# Patient Record
Sex: Female | Born: 1995 | Race: Black or African American | Hispanic: No | Marital: Single | Smoking: Former smoker
Health system: Southern US, Community
[De-identification: ages and names within clinical notes are randomized; demographics above are authoritative.]

## PROBLEM LIST (undated history)

## (undated) DIAGNOSIS — F419 Anxiety disorder, unspecified: Secondary | ICD-10-CM

## (undated) DIAGNOSIS — F329 Major depressive disorder, single episode, unspecified: Secondary | ICD-10-CM

## (undated) HISTORY — PX: NO PAST SURGERIES: SHX2092

## (undated) HISTORY — DX: Anxiety disorder, unspecified: F41.9

## (undated) HISTORY — PX: WISDOM TOOTH EXTRACTION: SHX21

---
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
## 2018-03-15 ENCOUNTER — Ambulatory Visit (HOSPITAL_COMMUNITY)
Admission: EM | Admit: 2018-03-15 | Discharge: 2018-03-15 | Disposition: A | Discharge status: Home / Self Care | Payer: Medicaid Other | Attending: Family Medicine | Admitting: Family Medicine

## 2018-03-15 ENCOUNTER — Ambulatory Visit (INDEPENDENT_AMBULATORY_CARE_PROVIDER_SITE_OTHER): Payer: Medicaid Other

## 2018-03-15 ENCOUNTER — Encounter (HOSPITAL_COMMUNITY): Payer: Self-pay | Admitting: Family Medicine

## 2018-03-15 DIAGNOSIS — S93492A Sprain of other ligament of left ankle, initial encounter: Secondary | ICD-10-CM

## 2018-03-15 NOTE — ED Provider Notes (Signed)
[facility]    CSN: [id number] Arrival date & time: 03/15/18  1432     History   Chief Complaint Chief Complaint  Patient presents with  . Ankle Pain    HPI Eileen Melton is a 22 y.o. female.   Patient was playing with her cousin 1 week ago and injured the ankle.  There was no associated swelling and she was able to continue playing and walking since then. 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  Today there is pain medial as well as laterally in the ligamentous area distal to the malleoli.  HPI  History reviewed. No pertinent past medical history.  There are no active problems to display for this patient.   History reviewed. No pertinent surgical history.  OB History   None      Home Medications    Prior to Admission medications   Medication Sig Start Date End Date Taking? Authorizing Provider  ibuprofen (ADVIL,MOTRIN) 200 MG tablet Take 400 mg by mouth every 8 (eight) hours as needed for pain.    [provider]    Family History History reviewed. No pertinent family history.  Social History Social History   Tobacco Use  . Smoking status: Not on file  Substance Use Topics  . Alcohol use: Not on file  . Drug use: Not on file     Allergies   Patient has no known allergies.   Review of Systems Review of Systems  Constitutional: Negative.   HENT: Positive for voice change.   Respiratory: Negative.   Cardiovascular: Negative.   Genitourinary: Negative.   Musculoskeletal:       Pain in left ankle  Neurological: Negative.   Psychiatric/Behavioral: Negative.      Physical Exam Triage Vital Signs ED Triage Vitals  Enc Vitals Group     BP 03/15/18 1452 126/64     Pulse Rate 03/15/18 1452 81     Resp 03/15/18 1452 18     Temp 03/15/18 1452 98.3 F (36.8 C)     Temp Source 03/15/18 1452 Oral     SpO2 03/15/18 1452 98 %     Weight --    Height --      Head Circumference --      Peak Flow --      Pain Score 03/15/18 1450 5     Pain Loc --      Pain Edu? --      Excl. in GC? --    No data found.  Updated Vital Signs BP 126/64   Pulse 81   Temp 98.3 F (36.8 C) (Oral)   Resp 18   LMP 03/01/2018   SpO2 98%   Visual Acuity Right Eye Distance:   Left Eye Distance:   Bilateral Distance:    Right Eye Near:   Left Eye Near:    Bilateral Near:     Physical Exam   UC Treatments / Results  Labs (all labs ordered are listed, but only abnormal results are displayed) Labs Reviewed - No data to display  EKG None Radiology No results found.  Procedures Procedures (including critical care time)  Medications Ordered in UC Medications - No data to display   Initial Impression / Assessment and Plan / UC Course  I have reviewed the triage vital signs and the nursing notes.  Pertinent labs & imaging results that  were available during my care of the patient were reviewed by me and considered in my medical decision making (see chart for details).    Sprain left ankle.  Treat conservatively with RICE therapy.  Encourage early ambulation as possible  Final Clinical Impressions(s) / UC Diagnoses   Final diagnoses:  None    ED Discharge Orders    None       Controlled Substance Prescriptions  Controlled Substance Registry consulted? Not Applicable   Frederica Kuster, MD 03/15/18 1549

## 2018-03-15 NOTE — ED Triage Notes (Signed)
Pt here for left ankle pain. Reports that she twisted It a week ago and this am was in the shower and felt a pop. She is also complaining of hoarseness.

## 2018-08-09 ENCOUNTER — Encounter (HOSPITAL_COMMUNITY): Payer: Self-pay | Admitting: Emergency Medicine

## 2018-08-09 ENCOUNTER — Ambulatory Visit (HOSPITAL_COMMUNITY)
Admission: EM | Admit: 2018-08-09 | Discharge: 2018-08-09 | Disposition: A | Discharge status: Home / Self Care | Payer: Medicaid Other | Attending: Family Medicine | Admitting: Family Medicine

## 2018-08-09 DIAGNOSIS — N10 Acute tubulo-interstitial nephritis: Secondary | ICD-10-CM | POA: Diagnosis not present

## 2018-08-09 DIAGNOSIS — R109 Unspecified abdominal pain: Secondary | ICD-10-CM | POA: Insufficient documentation

## 2018-08-09 DIAGNOSIS — I10 Essential (primary) hypertension: Secondary | ICD-10-CM | POA: Insufficient documentation

## 2018-08-09 DIAGNOSIS — Z885 Allergy status to narcotic agent status: Secondary | ICD-10-CM | POA: Insufficient documentation

## 2018-08-09 DIAGNOSIS — Z3202 Encounter for pregnancy test, result negative: Secondary | ICD-10-CM | POA: Diagnosis not present

## 2018-08-09 DIAGNOSIS — R110 Nausea: Secondary | ICD-10-CM | POA: Diagnosis not present

## 2018-08-09 LAB — POCT URINALYSIS DIP (DEVICE)
BILIRUBIN URINE: NEGATIVE
Glucose, UA: NEGATIVE mg/dL
KETONES UR: NEGATIVE mg/dL
Nitrite: NEGATIVE
PH: 6.5 (ref 5.0–8.0)
Protein, ur: NEGATIVE mg/dL
Specific Gravity, Urine: 1.01 (ref 1.005–1.030)
Urobilinogen, UA: 0.2 mg/dL (ref 0.0–1.0)

## 2018-08-09 LAB — POCT PREGNANCY, URINE: Preg Test, Ur: NEGATIVE

## 2018-08-09 MED ORDER — CEFTRIAXONE SODIUM 250 MG IJ SOLR
INTRAMUSCULAR | Status: AC
  Filled 2018-08-09: qty 250

## 2018-08-09 MED ORDER — CEPHALEXIN 500 MG PO CAPS: 500.0000 mg | ORAL_CAPSULE | Freq: Two times a day (BID) | ORAL | 0 refills | Status: DC

## 2018-08-09 MED ORDER — CEFTRIAXONE SODIUM 250 MG IJ SOLR
250.0000 mg | Freq: Once | INTRAMUSCULAR | Status: AC
  Administered 2018-08-09: 250 mg via INTRAMUSCULAR

## 2018-08-09 MED ORDER — IBUPROFEN 800 MG PO TABS: 800.0000 mg | ORAL_TABLET | Freq: Three times a day (TID) | ORAL | 0 refills | Status: DC

## 2018-08-09 NOTE — ED Provider Notes (Signed)
[facility]    CSN: [id number] Arrival date & time: 08/09/18  1935     History   Chief Complaint Chief Complaint  Patient presents with  . Flank Pain    HPI Eileen Melton is a 22 y.o. female.   HPI  Patient states she is had bladder infection symptoms for 2 or 3 days.  Some urinary frequency.  No dysuria.  Urgency to get to the bathroom quickly.  Today she developed flank pain.  Nausea.  Today she is also had a fever but did not take her temperature, she had some sweats and chills.  She is never had kidney infections before.  She did have a bacterial vaginal infection a month ago and took 7 days of Flagyl.  She states that this improved her symptoms.  History reviewed. No pertinent past medical history.  There are no active problems to display for this patient.   History reviewed. No pertinent surgical history.  OB History   None      Home Medications    Prior to Admission medications   Medication Sig Start Date End Date Taking? Authorizing Provider  cephALEXin (KEFLEX) 500 MG capsule Take 1 capsule (500 mg total) by mouth 2 (two) times daily. 08/09/18   Eustace MooreNelson, Kalei Mckillop Sue, MD  ibuprofen (ADVIL,MOTRIN) 800 MG tablet Take 1 tablet (800 mg total) by mouth 3 (three) times daily. 08/09/18   Eustace MooreNelson, Jimi Giza Sue, MD    Family History No family history on file. Patient states no history of kidney problems or kidney stones.  There is history of hypertension. Social History Social History   Tobacco Use  . Smoking status: Not on file  Substance Use Topics  . Alcohol use: Not on file  . Drug use: Not on file     Allergies   Hydrocodone-acetaminophen   Review of Systems Review of Systems  Constitutional: Positive for fatigue and fever. Negative for chills.  HENT: Negative for ear pain and sore throat.   Eyes: Negative for pain and visual disturbance.  Respiratory: Negative for cough and shortness of breath.   Cardiovascular: Negative for chest pain and  palpitations.  Gastrointestinal: Positive for nausea. Negative for abdominal pain and vomiting.  Genitourinary: Positive for dysuria, flank pain and frequency. Negative for hematuria.  Musculoskeletal: Negative for arthralgias and back pain.  Skin: Negative for color change and rash.  Neurological: Negative for seizures and syncope.  All other systems reviewed and are negative.    Physical Exam Triage Vital Signs ED Triage Vitals [08/09/18 1943]  Enc Vitals Group     BP 120/68     Pulse Rate 79     Resp 16     Temp 98.3 F (36.8 C)     Temp src      SpO2 100 %     Weight      Height      Head Circumference      Peak Flow      Pain Score      Pain Loc      Pain Edu?      Excl. in GC?    No data found.  Updated Vital Signs BP 120/68   Pulse 79   Temp 98.3 F (36.8 C)   Resp 16   LMP 07/26/2018   SpO2 100%   Visual Acuity Right Eye Distance:   Left Eye Distance:   Bilateral Distance:    Right Eye Near:   Left Eye Near:  Bilateral Near:     Physical Exam  Constitutional: She appears well-developed and well-nourished. No distress.  HENT:  Head: Normocephalic and atraumatic.  Right Ear: External ear normal.  Left Ear: External ear normal.  Mouth/Throat: Oropharynx is clear and moist.  Eyes: Pupils are equal, round, and reactive to light. Conjunctivae are normal.  Neck: Normal range of motion.  Cardiovascular: Normal rate, regular rhythm and normal heart sounds.  Pulmonary/Chest: Effort normal. No respiratory distress.  CVA tenderness right  Abdominal: Soft. Bowel sounds are normal. She exhibits no distension. There is tenderness.  Tenderness to deep palpation right mid abdomen  Musculoskeletal: Normal range of motion. She exhibits no edema.  Neurological: She is alert.  Skin: Skin is warm and dry.  Psychiatric: She has a normal mood and affect. Her behavior is normal.     UC Treatments / Results  Labs (all labs ordered are listed, but only abnormal  results are displayed) Labs Reviewed  POCT URINALYSIS DIP (DEVICE) - Abnormal; Notable for the following components:      Result Value   Hgb urine dipstick MODERATE (*)    Leukocytes, UA TRACE (*)    All other components within normal limits  URINE CULTURE  POCT PREGNANCY, URINE    EKG None  Radiology No results found.  Procedures Procedures (including critical care time)  Medications Ordered in UC Medications  cefTRIAXone (ROCEPHIN) injection 250 mg (250 mg Intramuscular Given 08/09/18 2018)    Initial Impression / Assessment and Plan / UC Course  I have reviewed the triage vital signs and the nursing notes.  Pertinent labs & imaging results that were available during my care of the patient were reviewed by me and considered in my medical decision making (see chart for details).     Gust pyelonephritis.  Treat with antibiotics.  Push fluids.  Return to ER for any worsening symptoms, vomiting, or inability to keep down antibiotics Final Clinical Impressions(s) / UC Diagnoses   Final diagnoses:  Acute pyelonephritis     Discharge Instructions     Take the antibiotic twice a day until gone Take ibuprofen 3 times a day with food, as needed for pain Push fluids.  Drink lots of water Return if you do not see improvement over the next 24 to 48 hours   ED Prescriptions    Medication Sig Dispense Auth. Provider   cephALEXin (KEFLEX) 500 MG capsule Take 1 capsule (500 mg total) by mouth 2 (two) times daily. 20 capsule Eustace Moore, MD   ibuprofen (ADVIL,MOTRIN) 800 MG tablet Take 1 tablet (800 mg total) by mouth 3 (three) times daily. 21 tablet Eustace Moore, MD     Controlled Substance Prescriptions [place] Controlled Substance Registry consulted? Not Applicable   Eustace Moore, MD 08/09/18 2042

## 2018-08-09 NOTE — Discharge Instructions (Signed)
Take the antibiotic twice a day until gone Take ibuprofen 3 times a day with food, as needed for pain Push fluids.  Drink lots of water Return if you do not see improvement over the next 24 to 48 hours

## 2018-08-09 NOTE — ED Triage Notes (Signed)
Pt c/o L flank pain starting today, with urinary frequency and urgency, pt also states she had a bacterial infection a month ago and thinks it didn't go away.

## 2018-08-12 ENCOUNTER — Telehealth (HOSPITAL_COMMUNITY): Payer: Self-pay

## 2018-08-12 LAB — URINE CULTURE: Culture: 20000 — AB

## 2018-08-12 NOTE — Telephone Encounter (Signed)
Urine culture positive for E.coli. This was treated with keflex at ucc visit. Pt called and made aware. 

## 2018-10-04 LAB — PREGNANCY, URINE: Preg Test, Ur: POSITIVE

## 2018-10-10 ENCOUNTER — Encounter: Payer: Self-pay | Admitting: *Deleted

## 2018-10-10 ENCOUNTER — Encounter (HOSPITAL_COMMUNITY): Payer: Self-pay | Admitting: Emergency Medicine

## 2018-10-10 ENCOUNTER — Ambulatory Visit (HOSPITAL_COMMUNITY)
Admission: EM | Admit: 2018-10-10 | Discharge: 2018-10-10 | Disposition: A | Discharge status: Home / Self Care | Payer: Medicaid Other | Attending: Family Medicine | Admitting: Family Medicine

## 2018-10-10 DIAGNOSIS — L500 Allergic urticaria: Secondary | ICD-10-CM

## 2018-10-10 MED ORDER — FAMOTIDINE 20 MG PO TABS
ORAL_TABLET | ORAL | Status: AC
  Filled 2018-10-10: qty 1

## 2018-10-10 MED ORDER — DIPHENHYDRAMINE HCL 50 MG/ML IJ SOLN
INTRAMUSCULAR | Status: AC
  Filled 2018-10-10: qty 1

## 2018-10-10 MED ORDER — DIPHENHYDRAMINE HCL 50 MG/ML IJ SOLN
50.0000 mg | Freq: Once | INTRAMUSCULAR | Status: AC
  Administered 2018-10-10: 50 mg via INTRAMUSCULAR

## 2018-10-10 MED ORDER — FAMOTIDINE 20 MG PO TABS
20.0000 mg | ORAL_TABLET | Freq: Once | ORAL | Status: AC
  Administered 2018-10-10: 20 mg via ORAL

## 2018-10-10 MED ORDER — CIMETIDINE 200 MG PO TABS: 400.0000 mg | ORAL_TABLET | Freq: Two times a day (BID) | ORAL | 0 refills | Status: DC | PRN

## 2018-10-10 NOTE — ED Triage Notes (Addendum)
PT developed hives all over body yesterday. PT saw her PCP today and was instructed to take benadryl. PT took 50 mg benadryl at 2:30pm. Hives have worsened since then. PT denies SOB, but reports a pain in center of chest that feels like she took a bite that she couldn't swallow.   PT is pregnant.

## 2018-10-10 NOTE — ED Provider Notes (Signed)
[facility]    CSN: [id number] Arrival date & time: 10/10/18  1815     History   Chief Complaint Chief Complaint  Patient presents with  . Urticaria    HPI Eileen Melton is a 22 y.o. female.   22 year old female presents with hives that started yesterday afternoon. She started breaking out in hives on the left side of her neck yesterday. She then had more itching on her legs and arms later yesterday and then broke out in hives on her legs and arms in the evening. Today the hives have spread to her face, chest, back, arms, legs and feet. She ate Okra recently and feels she may be allergic since she has not eaten this in many years. She took oral Benadryl yesterday and saw her PCP this morning who suggested continuing 50mg  Benadryl. Patient is currently about [redacted] weeks pregnant. She denies any fever, sore throat, difficulty swallowing or shortness of breath but she indicates her chest does feel "heavy". She denies any GI symptoms. Only current medication is Prenatal vitamins. No other chronic health issues.   The history is provided by the patient.    History reviewed. No pertinent past medical history.  There are no active problems to display for this patient.   Past Surgical History:  Procedure Laterality Date  . WISDOM TOOTH EXTRACTION      OB History    Gravida  1   Para      Term      Preterm      AB      Living        SAB      TAB      Ectopic      Multiple  1   Live Births               Home Medications    Prior to Admission medications   Medication Sig Start Date End Date Taking? Authorizing Provider  cimetidine (TAGAMET) 200 MG tablet Take 2 tablets (400 mg total) by mouth 2 (two) times daily as needed (for itching/hives). 10/10/18   Sudie Grumbling, NP    Family History No family history on file.  Social History Social History   Tobacco Use  . Smoking status: Not on file  Substance Use Topics  . Alcohol use: Not on  file  . Drug use: Not on file     Allergies   Hydrocodone-acetaminophen   Review of Systems Review of Systems  Constitutional: Negative for activity change, appetite change, chills, diaphoresis, fatigue and fever.  HENT: Negative for congestion, facial swelling, postnasal drip, rhinorrhea, sore throat and trouble swallowing.   Eyes: Negative for photophobia, pain, discharge, redness, itching and visual disturbance.  Respiratory: Positive for chest tightness. Negative for cough, shortness of breath, wheezing and stridor.   Cardiovascular: Negative for chest pain and palpitations.  Gastrointestinal: Negative for abdominal pain, nausea and vomiting.  Musculoskeletal: Negative for arthralgias and myalgias.  Skin: Positive for color change and rash. Negative for wound.  Allergic/Immunologic: Negative for environmental allergies.  Neurological: Negative for dizziness, tremors, seizures, syncope, weakness, light-headedness, numbness and headaches.  Hematological: Negative for adenopathy. Does not bruise/bleed easily.  Psychiatric/Behavioral: Negative.      Physical Exam Triage Vital Signs ED Triage Vitals  Enc Vitals Group     BP 10/10/18 1838 137/80     Pulse Rate 10/10/18 1838 (!) 109     Resp 10/10/18 1838 16  Temp 10/10/18 1838 98.4 F (36.9 C)     Temp Source 10/10/18 1838 Oral     SpO2 10/10/18 1838 100 %     Weight --      Height --      Head Circumference --      Peak Flow --      Pain Score 10/10/18 1839 3     Pain Loc --      Pain Edu? --      Excl. in GC? --    No data found.  Updated Vital Signs BP 137/80   Pulse (!) 109   Temp 98.4 F (36.9 C) (Oral)   Resp 16   LMP 07/26/2018   SpO2 100%   Breastfeeding? Unknown   Visual Acuity Right Eye Distance:   Left Eye Distance:   Bilateral Distance:    Right Eye Near:   Left Eye Near:    Bilateral Near:     Physical Exam  Constitutional: She is oriented to person, place, and time. She appears  well-developed and well-nourished. She is cooperative. She does not appear ill. No distress.  Patient sitting comfortably on exam table in no acute distress.   HENT:  Head: Normocephalic and atraumatic.  Right Ear: Hearing and external ear normal.  Left Ear: Hearing and external ear normal.  Nose: Nose normal. No mucosal edema.  Mouth/Throat: Uvula is midline, oropharynx is clear and moist and mucous membranes are normal. No oropharyngeal exudate.  Eyes: Pupils are equal, round, and reactive to light. Conjunctivae and EOM are normal. Right eye exhibits no discharge. Left eye exhibits no discharge.  Neck: Trachea normal and normal range of motion. Neck supple.  Cardiovascular: Regular rhythm, normal heart sounds and normal pulses. Tachycardia present.  Pulmonary/Chest: Effort normal and breath sounds normal. No accessory muscle usage or stridor. No tachypnea. No respiratory distress. She has no decreased breath sounds. She has no wheezes. She has no rhonchi. She has no rales.  Musculoskeletal: Normal range of motion.  Neurological: She is alert and oriented to person, place, and time.  Skin: Skin is warm and intact. Capillary refill takes less than 2 seconds. Rash noted. Rash is urticarial.     Extensive urticarial lesions present on forehead, cheeks, neck, chest, arms, legs, back and feet. No discharge or excoriation. Non-tender. No warmth.   Psychiatric: She has a normal mood and affect. Her behavior is normal. Judgment and thought content normal.  Vitals reviewed.    UC Treatments / Results  Labs (all labs ordered are listed, but only abnormal results are displayed) Labs Reviewed - No data to display  EKG None  Radiology No results found.  Procedures Procedures (including critical care time)  Medications Ordered in UC Medications  diphenhydrAMINE (BENADRYL) injection 50 mg (50 mg Intramuscular Given 10/10/18 1935)  famotidine (PEPCID) tablet 20 mg (20 mg Oral Given 10/10/18  1935)    Initial Impression / Assessment and Plan / UC Course  I have reviewed the triage vital signs and the nursing notes.  Pertinent labs & imaging results that were available during my care of the patient were reviewed by me and considered in my medical decision making (see chart for details).    Consulted with Dr. Delton See. Discussed with patient that usual treatment is with IM and oral Prednisone but Prednisone is not indicated in first trimester of pregnancy. Recommend Benadryl 50mg  IM now. Also gave oral Pepcid 20mg . Patient indicated after 30 minutes, that she did not itch as severely  and no new hives have appeared.  Discussed that since she is not in any immediate distress (no difficulty swallowing, breathing or shortness of breath), that she can monitor her symptoms. Recommend continue oral Benadryl 50mg  every 4 to 6 hours as needed for itching and hives. May also use oral Tagamet 200mg - 1 to 2 tablets every 12 hours as needed for itching. Use cool compresses and oatmeal baths for comfort. Avoid heat and warmth which will make her symptoms worse. If any difficulty breathing or swallowing, shortness of breath or more severe chest pain occur, go to the ER ASAP. Otherwise, follow-up with her PCP in 2 to 3 days for recheck.  Final Clinical Impressions(s) / UC Diagnoses   Final diagnoses:  Allergic urticaria     Discharge Instructions     You were given a shot of Benadryl 50mg  today along with oral Pepcid to help with the hives and itching. Recommend continue Benadryl 50mg  every 4 to 6 hours as needed. May also use Cimetidine 200mg  tablets- take 1 to 2 tablets twice a day as needed for hives. Use cool compresses/oatmeal baths for comfort. If any difficulty breathing or swallowing or worsening of hives occurs, go to ER ASAP. Otherwise, follow-up with your PCP in 2 to 3 days for recheck.     ED Prescriptions    Medication Sig Dispense Auth. Provider   cimetidine (TAGAMET) 200 MG tablet  Take 2 tablets (400 mg total) by mouth 2 (two) times daily as needed (for itching/hives). 20 tablet Sudie Grumbling, NP     Controlled Substance Prescriptions [place] Controlled Substance Registry consulted? N/A    Sudie Grumbling, NP 10/10/18 2113

## 2018-10-10 NOTE — Discharge Instructions (Addendum)
You were given a shot of Benadryl 50mg  today along with oral Pepcid to help with the hives and itching. Recommend continue Benadryl 50mg  every 4 to 6 hours as needed. May also use Cimetidine 200mg  tablets- take 1 to 2 tablets twice a day as needed for hives. Use cool compresses/oatmeal baths for comfort. If any difficulty breathing or swallowing or worsening of hives occurs, go to ER ASAP. Otherwise, follow-up with your PCP in 2 to 3 days for recheck.

## 2018-10-11 ENCOUNTER — Emergency Department (HOSPITAL_COMMUNITY)
Admission: EM | Admit: 2018-10-11 | Discharge: 2018-10-11 | Disposition: A | Discharge status: Home / Self Care | Payer: Medicaid Other | Attending: Emergency Medicine | Admitting: Emergency Medicine

## 2018-10-11 ENCOUNTER — Encounter (HOSPITAL_COMMUNITY): Payer: Self-pay | Admitting: *Deleted

## 2018-10-11 DIAGNOSIS — L509 Urticaria, unspecified: Secondary | ICD-10-CM | POA: Insufficient documentation

## 2018-10-11 DIAGNOSIS — R21 Rash and other nonspecific skin eruption: Secondary | ICD-10-CM | POA: Diagnosis present

## 2018-10-11 MED ORDER — DIPHENHYDRAMINE HCL 50 MG/ML IJ SOLN: 25.0000 mg | Freq: Once | INTRAMUSCULAR | Status: DC

## 2018-10-11 MED ORDER — TRIAMCINOLONE ACETONIDE 0.1 % EX CREA: 1.0000 "application " | TOPICAL_CREAM | Freq: Two times a day (BID) | CUTANEOUS | 0 refills | Status: DC

## 2018-10-11 MED ORDER — METHYLPREDNISOLONE SODIUM SUCC 40 MG IJ SOLR
40.0000 mg | Freq: Once | INTRAMUSCULAR | Status: AC
  Administered 2018-10-11: 40 mg via INTRAMUSCULAR
  Filled 2018-10-11: qty 1

## 2018-10-11 NOTE — ED Provider Notes (Signed)
Patient placed in Quick Look pathway, seen and evaluated   Chief Complaint: allergic reaction  HPI:  Eileen Melton is a 22 y.o. female who presents to the ED with rash and itching that started 2 days ago. Patient states she went to Urgent Care yesterday and they gave her Benadryl and Pepcid. Patient reports symptoms improved but then this morning they were worse again. Patient reports she had a positive pregnancy test yesterday at Urgent Care. Patient denies difficulty swallowing or respiratory symptoms.  ROS: Skin: rash, itching  Physical Exam:  BP 131/79 (BP Location: Right Arm)   Pulse (!) 113   Temp 98.2 F (36.8 C) (Oral)   Resp 20   LMP 07/26/2018   SpO2 100%    Gen: No distress  Neuro: Awake and Alert  Skin: hives to face and arms  Resp: no distress  ENT: no difficulty swallowing   Initiation of care has begun. The patient has been counseled on the process, plan, and necessity for staying for the completion/evaluation, and the remainder of the medical screening examination    Janne Napoleon, NP 10/11/18 1340    Eber Hong, MD 10/12/18 [phone]

## 2018-10-11 NOTE — Discharge Instructions (Addendum)
You have received a shot of steroids today that should help with the rash. Continue to take the Benadryl and Pepcid as you were directed too. Rash should begin to resolve over the next few days especially with help from the steroid. I have also sent in a steroid cream that you can use on smaller areas that persist.  Please follow-up with your PCP if the rash is still significant after 1 week.  Thank you for allowing Eileen Melton to take care of you today.

## 2018-10-11 NOTE — ED Triage Notes (Signed)
Pt in with worsening allergic reaction, pt had initial reaction two days, has been taking benadryl and yesterday went to urgent care yesterday and got IM benadryl and pepcid, this morning before a shower she had significant increase in rash and swelling, rash had never fully resolved but is now raised and itching more, denies airway issues at this time

## 2018-10-11 NOTE — ED Provider Notes (Signed)
[facility] EMERGENCY DEPARTMENT Provider Note  CSN: [id number] Arrival date & time: 10/11/18  1308  History   Chief Complaint Chief Complaint  Patient presents with  . Allergic Reaction    HPI Eileen Melton is a 22 y.o. female who is in 1st trimester of pregnancyith who presented to the ED for rash. She reports that raised, pruritic lesions first appeared on her neck 2 days ago after eating okra and Nerds. She has been taking scheduled PO Benadryl and Pepcid with minimal relief. She states that rash is spread to her face and extremities was seen at urgent care yesterday for the same issue and received IM Benadryl there which provided relief from itching.  Rash   This is a new problem. The current episode started 2 days ago. The problem has not changed since onset.Associated with: new food exposure (okra) There has been no fever. The rash is present on the face, neck, right arm, left arm, left upper leg, left lower leg, right upper leg and right lower leg. Associated symptoms include itching. Pertinent negatives include no blisters, no pain and no weeping. Treatments tried: Benadryl and Pepcid. The treatment provided mild relief.    History reviewed. No pertinent past medical history.  There are no active problems to display for this patient.   Past Surgical History:  Procedure Laterality Date  . WISDOM TOOTH EXTRACTION       OB History    Gravida  1   Para      Term      Preterm      AB      Living        SAB      TAB      Ectopic      Multiple  1   Live Births               Home Medications    Prior to Admission medications   Medication Sig Start Date End Date Taking? Authorizing Provider  cimetidine (TAGAMET) 200 MG tablet Take 2 tablets (400 mg total) by mouth 2 (two) times daily as needed (for itching/hives). 10/10/18   Sudie Grumbling, NP    Family History History reviewed. No pertinent family history.  Social  History Social History   Tobacco Use  . Smoking status: Not on file  Substance Use Topics  . Alcohol use: Not on file  . Drug use: Not on file     Allergies   Hydrocodone-acetaminophen   Review of Systems Review of Systems  Constitutional: Negative.   HENT: Negative.   Eyes: Negative.   Respiratory: Negative for cough, choking, chest tightness and shortness of breath.   Cardiovascular: Positive for chest pain. Negative for palpitations.  Gastrointestinal: Negative for abdominal pain, nausea and vomiting.  Musculoskeletal: Negative.   Skin: Positive for itching and rash.  Neurological: Negative for weakness and numbness.  Hematological: Negative.    Physical Exam Updated Vital Signs BP 131/79 (BP Location: Right Arm)   Pulse (!) 113   Temp 98.2 F (36.8 C) (Oral)   Resp 20   LMP 07/26/2018   SpO2 100%   Physical Exam  Constitutional: She appears well-developed and well-nourished.  Sitting comfortably in chair.  HENT:  Head: Normocephalic and atraumatic.  Mouth/Throat: Uvula is midline, oropharynx is clear and moist and mucous membranes are normal.  Cardiovascular: Normal rate, regular rhythm and normal heart sounds.  Pulmonary/Chest: Effort normal and breath sounds normal. No tachypnea. No respiratory  distress. She has no decreased breath sounds. She has no wheezes.  Musculoskeletal: Normal range of motion.  Skin: Skin is warm and intact. Capillary refill takes less than 2 seconds. Rash noted. Rash is urticarial.  Urticarial rash on patient's face, neck, bilateral upper and lower extremities and trunk. No surrounding erythema, streaking, blisters, ulcerations, drainage or areas of desquamation. Rash non-tender to palpation.  Nursing note and vitals reviewed.    ED Treatments / Results  Labs (all labs ordered are listed, but only abnormal results are displayed) Labs Reviewed - No data to display  EKG None  Radiology No results  found.  Procedures Procedures (including critical care time)  Medications Ordered in ED Medications - No data to display   Initial Impression / Assessment and Plan / ED Course  Triage vital signs and the nursing notes have been reviewed.  Pertinent labs & imaging results that were available during care of the patient were reviewed and considered in medical decision making (see chart for details).   Patient presents to the ED afebrile and well appearing with complaints of an urticarial rash that has been present for 2 days. She correlates rash with new food exposure 2 days ago. Rash is on her face, extremities and trunk. Patient has had itching relief with Benadryl and Pepcid and was seen at the urgent care for the same complaint. There is no blistering, bleeding, burns, drainage, ulcerations or desquamation to suggest SJS/TEN or acute necrotizing derm process.   Final Clinical Impressions(s) / ED Diagnoses  1. Urticaria. IM Solu-Medrol 40mg  x1 given in the ED to assist with resolution as they are widespread. No signs of anaphylaxis. Advised to continue Benadryl and Pepcid for relief. Rx for Kenalog also given for smaller areas of rash that go unresolved.  Dispo: Home. After thorough clinical evaluation, this patient is determined to be medically stable and can be safely discharged with the previously mentioned treatment and/or outpatient follow-up/referral(s). At this time, there are no other apparent medical conditions that require further screening, evaluation or treatment.   Final diagnoses:  Urticaria    ED Discharge Orders    None        Reva Bores 10/11/18 1631    Donnetta Hutching, MD 10/12/18 [phone]

## 2018-10-21 ENCOUNTER — Other Ambulatory Visit (HOSPITAL_COMMUNITY)
Admission: RE | Admit: 2018-10-21 | Discharge: 2018-10-21 | Disposition: A | Discharge status: Home / Self Care | Payer: Medicaid Other | Source: Ambulatory Visit | Attending: Obstetrics & Gynecology | Admitting: Obstetrics & Gynecology

## 2018-10-21 ENCOUNTER — Ambulatory Visit (INDEPENDENT_AMBULATORY_CARE_PROVIDER_SITE_OTHER): Payer: Medicaid Other | Admitting: Obstetrics & Gynecology

## 2018-10-21 ENCOUNTER — Ambulatory Visit: Payer: Self-pay | Admitting: Clinical

## 2018-10-21 ENCOUNTER — Encounter: Payer: Self-pay | Admitting: Obstetrics & Gynecology

## 2018-10-21 VITALS — BP 120/68 | HR 86 | Wt 127.3 lb

## 2018-10-21 DIAGNOSIS — Z3491 Encounter for supervision of normal pregnancy, unspecified, first trimester: Secondary | ICD-10-CM | POA: Insufficient documentation

## 2018-10-21 DIAGNOSIS — Z640 Problems related to unwanted pregnancy: Secondary | ICD-10-CM

## 2018-10-21 DIAGNOSIS — Z3490 Encounter for supervision of normal pregnancy, unspecified, unspecified trimester: Secondary | ICD-10-CM | POA: Insufficient documentation

## 2018-10-21 DIAGNOSIS — F1290 Cannabis use, unspecified, uncomplicated: Secondary | ICD-10-CM | POA: Insufficient documentation

## 2018-10-21 DIAGNOSIS — Z3A08 8 weeks gestation of pregnancy: Secondary | ICD-10-CM

## 2018-10-21 LAB — POCT URINALYSIS DIP (DEVICE)
BILIRUBIN URINE: NEGATIVE
Glucose, UA: NEGATIVE mg/dL
HGB URINE DIPSTICK: NEGATIVE
Ketones, ur: NEGATIVE mg/dL
LEUKOCYTES UA: NEGATIVE
Nitrite: NEGATIVE
PH: 7 (ref 5.0–8.0)
Protein, ur: NEGATIVE mg/dL
SPECIFIC GRAVITY, URINE: 1.015 (ref 1.005–1.030)
UROBILINOGEN UA: 1 mg/dL (ref 0.0–1.0)

## 2018-10-21 NOTE — Patient Instructions (Signed)
First Trimester of Pregnancy The first trimester of pregnancy is from week 1 until the end of week 13 (months 1 through 3). A week after a sperm fertilizes an egg, the egg will implant on the wall of the uterus. This embryo will begin to develop into a baby. Genes from you and your partner will form the baby. The female genes will determine whether the baby will be a boy or a girl. At 6-8 weeks, the eyes and face will be formed, and the heartbeat can be seen on ultrasound. At the end of 12 weeks, all the baby's organs will be formed. Now that you are pregnant, you will want to do everything you can to have a healthy baby. Two of the most important things are to get good prenatal care and to follow your health care provider's instructions. Prenatal care is all the medical care you receive before the baby's birth. This care will help prevent, find, and treat any problems during the pregnancy and childbirth. Body changes during your first trimester Your body goes through many changes during pregnancy. The changes vary from woman to woman.  You may gain or lose a couple of pounds at first.  You may feel sick to your stomach (nauseous) and you may throw up (vomit). If the vomiting is uncontrollable, call your health care provider.  You may tire easily.  You may develop headaches that can be relieved by medicines. All medicines should be approved by your health care provider.  You may urinate more often. Painful urination may mean you have a bladder infection.  You may develop heartburn as a result of your pregnancy.  You may develop constipation because certain hormones are causing the muscles that push stool through your intestines to slow down.  You may develop hemorrhoids or swollen veins (varicose veins).  Your breasts may begin to grow larger and become tender. Your nipples may stick out more, and the tissue that surrounds them (areola) may become darker.  Your gums may bleed and may be  sensitive to brushing and flossing.  Dark spots or blotches (chloasma, mask of pregnancy) may develop on your face. This will likely fade after the baby is born.  Your menstrual periods will stop.  You may have a loss of appetite.  You may develop cravings for certain kinds of food.  You may have changes in your emotions from day to day, such as being excited to be pregnant or being concerned that something may go wrong with the pregnancy and baby.  You may have more vivid and strange dreams.  You may have changes in your hair. These can include thickening of your hair, rapid growth, and changes in texture. Some women also have hair loss during or after pregnancy, or hair that feels dry or thin. Your hair will most likely return to normal after your baby is born.  What to expect at prenatal visits During a routine prenatal visit:  You will be weighed to make sure you and the baby are growing normally.  Your blood pressure will be taken.  Your abdomen will be measured to track your baby's growth.  The fetal heartbeat will be listened to between weeks 10 and 14 of your pregnancy.  Test results from any previous visits will be discussed.  Your health care provider may ask you:  How you are feeling.  If you are feeling the baby move.  If you have had any abnormal symptoms, such as leaking fluid, bleeding, severe headaches,   or abdominal cramping.  If you are using any tobacco products, including cigarettes, chewing tobacco, and electronic cigarettes.  If you have any questions.  Other tests that may be performed during your first trimester include:  Blood tests to find your blood type and to check for the presence of any previous infections. The tests will also be used to check for low iron levels (anemia) and protein on red blood cells (Rh antibodies). Depending on your risk factors, or if you previously had diabetes during pregnancy, you may have tests to check for high blood  sugar that affects pregnant women (gestational diabetes).  Urine tests to check for infections, diabetes, or protein in the urine.  An ultrasound to confirm the proper growth and development of the baby.  Fetal screens for spinal cord problems (spina bifida) and Down syndrome.  HIV (human immunodeficiency virus) testing. Routine prenatal testing includes screening for HIV, unless you choose not to have this test.  You may need other tests to make sure you and the baby are doing well.  Follow these instructions at home: Medicines  Follow your health care provider's instructions regarding medicine use. Specific medicines may be either safe or unsafe to take during pregnancy.  Take a prenatal vitamin that contains at least 600 micrograms (mcg) of folic acid.  If you develop constipation, try taking a stool softener if your health care provider approves. Eating and drinking  Eat a balanced diet that includes fresh fruits and vegetables, whole grains, good sources of protein such as meat, eggs, or tofu, and low-fat dairy. Your health care provider will help you determine the amount of weight gain that is right for you.  Avoid raw meat and uncooked cheese. These carry germs that can cause birth defects in the baby.  Eating four or five small meals rather than three large meals a day may help relieve nausea and vomiting. If you start to feel nauseous, eating a few soda crackers can be helpful. Drinking liquids between meals, instead of during meals, also seems to help ease nausea and vomiting.  Limit foods that are high in fat and processed sugars, such as fried and sweet foods.  To prevent constipation: ? Eat foods that are high in fiber, such as fresh fruits and vegetables, whole grains, and beans. ? Drink enough fluid to keep your urine clear or pale yellow. Activity  Exercise only as directed by your health care provider. Most women can continue their usual exercise routine during  pregnancy. Try to exercise for 30 minutes at least 5 days a week. Exercising will help you: ? Control your weight. ? Stay in shape. ? Be prepared for labor and delivery.  Experiencing pain or cramping in the lower abdomen or lower back is a good sign that you should stop exercising. Check with your health care provider before continuing with normal exercises.  Try to avoid standing for long periods of time. Move your legs often if you must stand in one place for a long time.  Avoid heavy lifting.  Wear low-heeled shoes and practice good posture.  You may continue to have sex unless your health care provider tells you not to. Relieving pain and discomfort  Wear a good support bra to relieve breast tenderness.  Take warm sitz baths to soothe any pain or discomfort caused by hemorrhoids. Use hemorrhoid cream if your health care provider approves.  Rest with your legs elevated if you have leg cramps or low back pain.  If you develop   varicose veins in your legs, wear support hose. Elevate your feet for 15 minutes, 3-4 times a day. Limit salt in your diet. Prenatal care  Schedule your prenatal visits by the twelfth week of pregnancy. They are usually scheduled monthly at first, then more often in the last 2 months before delivery.  Write down your questions. Take them to your prenatal visits.  Keep all your prenatal visits as told by your health care provider. This is important. Safety  Wear your seat belt at all times when driving.  Make a list of emergency phone numbers, including numbers for family, friends, the hospital, and police and fire departments. General instructions  Ask your health care provider for a referral to a local prenatal education class. Begin classes no later than the beginning of month 6 of your pregnancy.  Ask for help if you have counseling or nutritional needs during pregnancy. Your health care provider can offer advice or refer you to specialists for help  with various needs.  Do not use hot tubs, steam rooms, or saunas.  Do not douche or use tampons or scented sanitary pads.  Do not cross your legs for long periods of time.  Avoid cat litter boxes and soil used by cats. These carry germs that can cause birth defects in the baby and possibly loss of the fetus by miscarriage or stillbirth.  Avoid all smoking, herbs, alcohol, and medicines not prescribed by your health care provider. Chemicals in these products affect the formation and growth of the baby.  Do not use any products that contain nicotine or tobacco, such as cigarettes and e-cigarettes. If you need help quitting, ask your health care provider. You may receive counseling support and other resources to help you quit.  Schedule a dentist appointment. At home, brush your teeth with a soft toothbrush and be gentle when you floss. Contact a health care provider if:  You have dizziness.  You have mild pelvic cramps, pelvic pressure, or nagging pain in the abdominal area.  You have persistent nausea, vomiting, or diarrhea.  You have a bad smelling vaginal discharge.  You have pain when you urinate.  You notice increased swelling in your face, hands, legs, or ankles.  You are exposed to fifth disease or chickenpox.  You are exposed to German measles (rubella) and have never had it. Get help right away if:  You have a fever.  You are leaking fluid from your vagina.  You have spotting or bleeding from your vagina.  You have severe abdominal cramping or pain.  You have rapid weight gain or loss.  You vomit blood or material that looks like coffee grounds.  You develop a severe headache.  You have shortness of breath.  You have any kind of trauma, such as from a fall or a car accident. Summary  The first trimester of pregnancy is from week 1 until the end of week 13 (months 1 through 3).  Your body goes through many changes during pregnancy. The changes vary from  woman to woman.  You will have routine prenatal visits. During those visits, your health care provider will examine you, discuss any test results you may have, and talk with you about how you are feeling. This information is not intended to replace advice given to you by your health care provider. Make sure you discuss any questions you have with your health care provider. Document Released: 11/28/2001 Document Revised: 11/15/2016 Document Reviewed: 11/15/2016 Elsevier Interactive Patient Education  2018 Elsevier   Inc.  

## 2018-10-21 NOTE — Progress Notes (Signed)
Subjective:   Eileen Melton is a 22 y.o. G1P0000 at [redacted]w[redacted]d by LMP being seen today for her first obstetrical visit.  Patient is considering termination of pregnancy, unsure at this point. No problems. Uses THC occasionally. Pregnancy history fully reviewed.  Patient reports no complaints.  HISTORY: OB History  Gravida Para Term Preterm AB Living  1 0 0 0 0 0  SAB TAB Ectopic Multiple Live Births  0 0 0 0 0    # Outcome Date GA Lbr Len/2nd Weight Sex Delivery Anes PTL Lv  1 Current           Has not received HPV vaccine series.  History reviewed. No pertinent past medical history. Past Surgical History:  Procedure Laterality Date  . WISDOM TOOTH EXTRACTION     Family History  Problem Relation Age of Onset  . Aneurysm Mother   . Cancer Maternal Grandmother    Social History   Tobacco Use  . Smoking status: Never Smoker  . Smokeless tobacco: Never Used  Substance Use Topics  . Alcohol use: Not Currently  . Drug use: Yes    Types: Marijuana   Allergies  Allergen Reactions  . Hydrocodone-Acetaminophen Itching   Current Outpatient Medications on File Prior to Visit  Medication Sig Dispense Refill  . Prenatal Vit-Fe Fumarate-FA (MULTIVITAMIN-PRENATAL) 27-0.8 MG TABS tablet Take 1 tablet by mouth daily at 12 noon.     No current facility-administered medications on file prior to visit.     Review of Systems Pertinent items noted in HPI and remainder of comprehensive ROS otherwise negative.  Exam   Vitals:   10/21/18 1108  BP: 120/68  Pulse: 86  Weight: 127 lb 4.8 oz (57.7 kg)   Fetal Heart Rate (bpm): 155 on u/s. Patient informed that the ultrasound is considered a limited obstetric ultrasound and is not intended to be a complete ultrasound exam.  Patient also informed that the ultrasound is not being completed with the intent of assessing for fetal or placental anomalies or any pelvic abnormalities.  Explained that the purpose of today's ultrasound is to  assess for fetal heart rate.  Patient acknowledges the purpose of the exam and the limitations of the study  General: well-developed, well-nourished female in no acute distress  Breasts:  deferred  Skin: normal coloration and turgor, no rashes  Neurologic: oriented, normal, negative, normal mood  Extremities: normal strength, tone, and muscle mass, ROM of all joints is normal  HEENT PERRLA, extraocular movement intact and sclera clear, anicteric  Mouth/Teeth mucous membranes moist, pharynx normal without lesions and dental hygiene good  Neck supple and no masses  Cardiovascular: regular rate and rhythm  Respiratory:  no respiratory distress, normal breath sounds  Abdomen: soft, non-tender; bowel sounds normal; no masses,  no organomegaly  Pelvic: deferred    Assessment and Plan:  Pregnancy: G1P0000  1. Marijuana use Cautioned about use in pregnancy.  2. Unwanted pregnancy with possible plans for termination She was informed about resources for this.   3. Encounter for supervision of low-risk pregnancy in first trimester - [facility] AMB BABYSCRIPTS SCHEDULE OPTIMIZATION Initial labs including NIPS deferred until next visit. Continue prenatal vitamins. Ultrasound discussed; fetal anatomic survey: to be ordered later if needed. Problem list reviewed and updated. The nature of [facility] with multiple MDs and other Advanced Practice Providers was explained to patient; also emphasized that residents, students are part of our team. Routine obstetric precautions reviewed. Return  in about 4 weeks (around 11/18/2018) for OB 12 week visit (Babyscripts) and  labs including NIPS. This visit type can be changed to discuss contraception if she proceeds with termination of pregnancy; she was given information for LARCs . Also given information about HPV vaccine.     Jaynie Collins, MD, FACOG Obstetrician & Gynecologist, [facility] for [employer], [facility]

## 2018-10-21 NOTE — BH Specialist Note (Signed)
Integrated Behavioral Health Initial Visit  MRN: [id number] Name: EMMRY HINSCH  Number of Integrated Behavioral Health Clinician visits:: 1/6 Session Start time: 10:20  Session End time: 10:31 Total time: 15 minutes  Type of Service: Integrated Behavioral Health- Individual/Family Interpretor:No. Interpretor Name and Language: n/a   Warm Hand Off Completed.       SUBJECTIVE: TESHARA MOREE is a 22 y.o. female accompanied by Sibling Patient was referred by Jaynie Collins, MD for Initial OB introduction to integrated behavioral health services . Patient reports the following symptoms/concerns: Pt states no particular concerns at this time.  Duration of problem: n/a; Severity of problem: n/a  OBJECTIVE: Mood: Normal and Affect: Appropriate Risk of harm to self or others: No plan to harm self or others  LIFE CONTEXT: Family and Social: Pt has supportive family School/Work: - Self-Care: - Life Changes: Current pregnancy  GOALS ADDRESSED: Patient will: 1. Increase knowledge and/or ability of: healthy habits   INTERVENTIONS: Interventions utilized: Supportive Counseling  Standardized Assessments completed: GAD-7 and PHQ 9  ASSESSMENT: Patient currently experiencing Supervision of low-risk  pregnancy, in first trimester.   Patient may benefit from Initial OB introduction to integrated behavioral health services .  PLAN: 1. Follow up with behavioral health clinician on : As needed 2. Behavioral recommendations: n/a 3. Referral(s): Integrated [employer] (In Clinic) 4. "From scale of 1-10, how likely are you to follow plan?": 10  Rae Lips, LCSW  Depression screen [facility] 2/9 10/21/2018  Decreased Interest 0  Down, Depressed, Hopeless 0  PHQ - 2 Score 0  Altered sleeping 0  Tired, decreased energy 1  Change in appetite 1  Feeling bad or failure about yourself  0  Trouble concentrating 0  Moving slowly or fidgety/restless 0  Suicidal thoughts 0   PHQ-9 Score 2   GAD 7 : Generalized Anxiety Score 10/21/2018  Nervous, Anxious, on Edge 0  Control/stop worrying 1  Worry too much - different things 1  Trouble relaxing 0  Restless 0  Easily annoyed or irritable 1  Afraid - awful might happen 0  Total GAD 7 Score 3

## 2018-10-21 NOTE — Progress Notes (Signed)
Bedside ultrasound performed for FHR. FHR 164 bpm

## 2018-10-22 LAB — GC/CHLAMYDIA PROBE AMP (~~LOC~~) NOT AT ARMC
Chlamydia: NEGATIVE
Neisseria Gonorrhea: NEGATIVE

## 2018-11-19 ENCOUNTER — Encounter: Payer: Self-pay | Admitting: Family Medicine

## 2019-04-17 ENCOUNTER — Telehealth: Payer: Self-pay | Admitting: Family Medicine

## 2019-04-17 NOTE — Telephone Encounter (Signed)
Spoke with patient about her appointments. She stated she was no longer pregnant.

## 2019-07-20 IMAGING — DX DG ANKLE COMPLETE 3+V*L*
3 series · 3 of 3 positions shown · non-contrast
Comparison: None.

CLINICAL DATA: Felt something pop in left ankle. Left anterior
joint pain.

EXAM:
LEFT ANKLE COMPLETE - 3+ VIEW

[ankle ap]
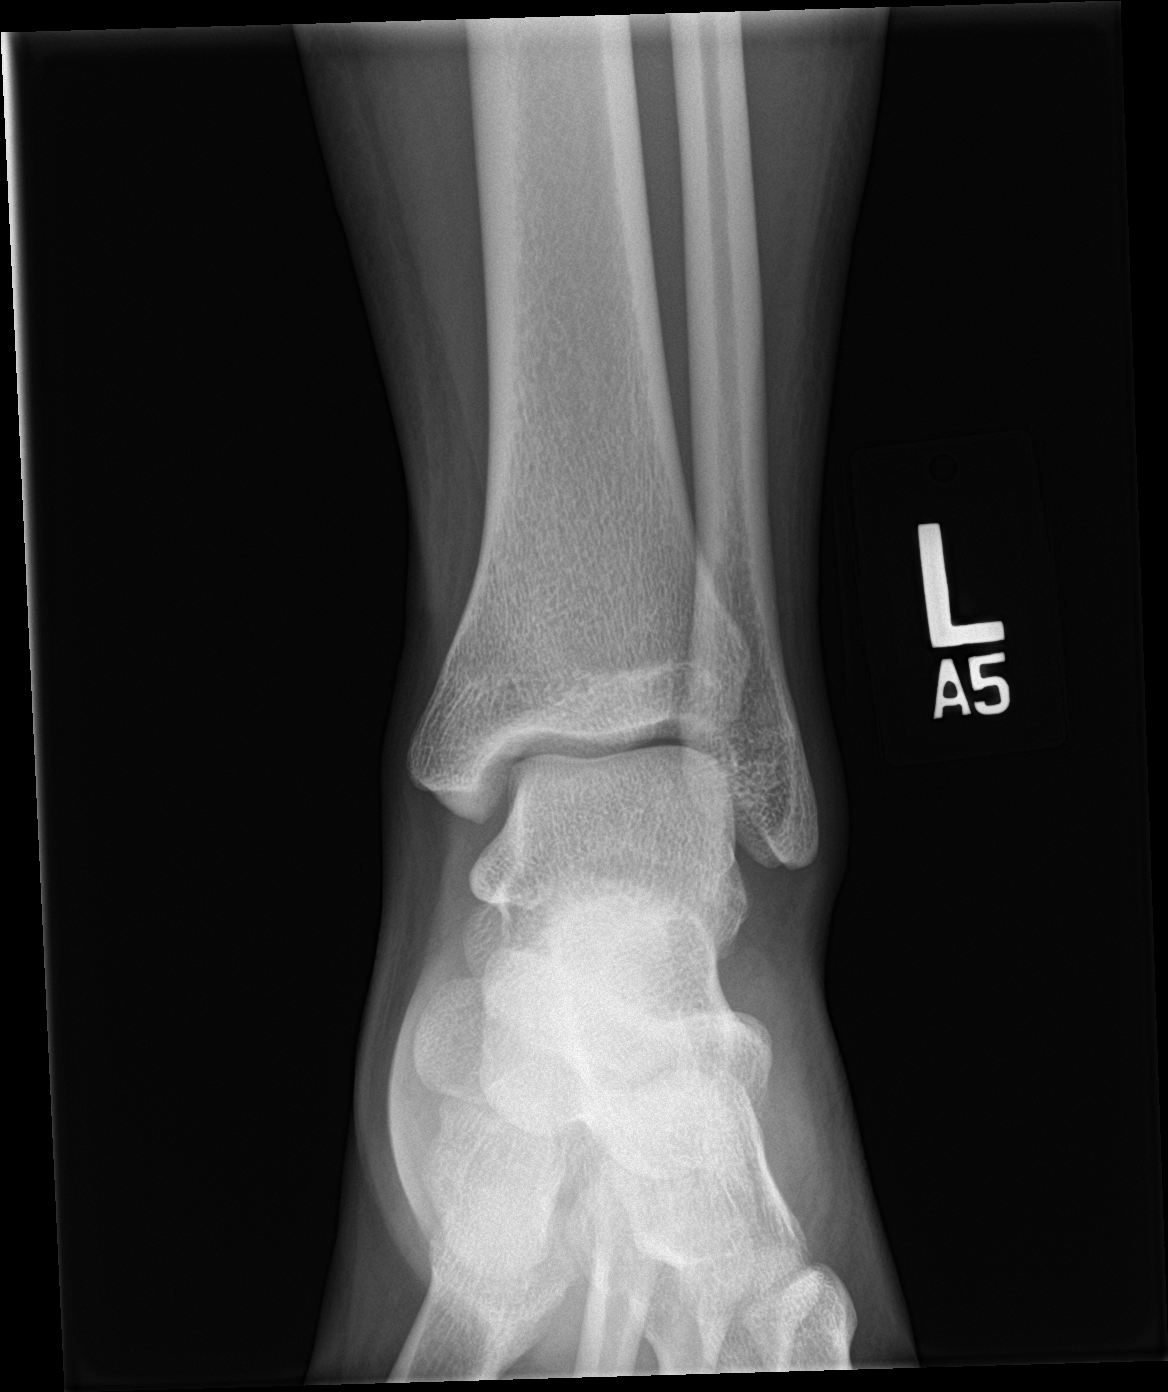

[ankle obl]
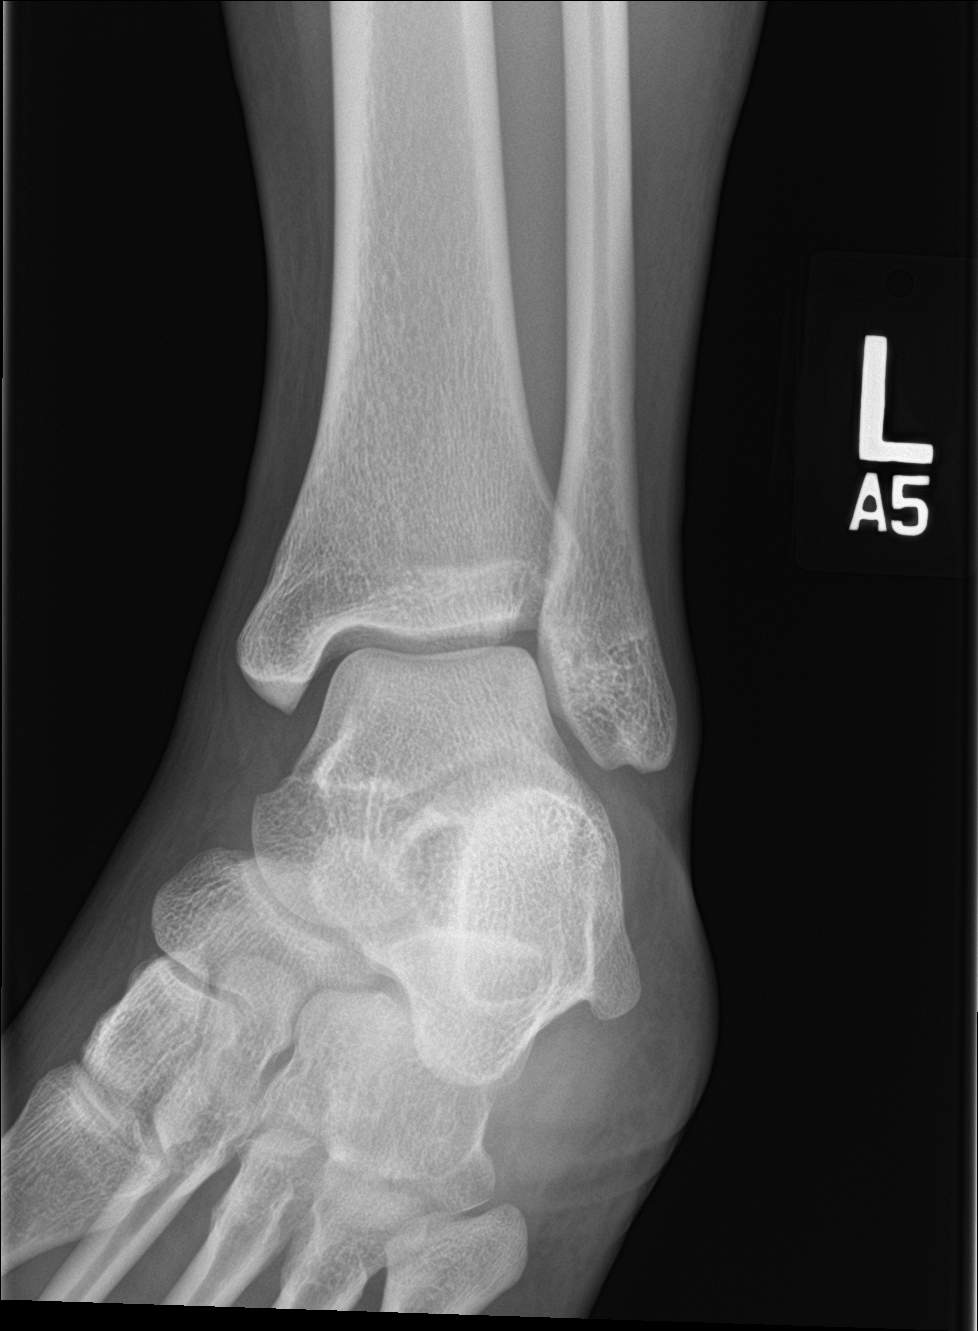

[ankle lat]
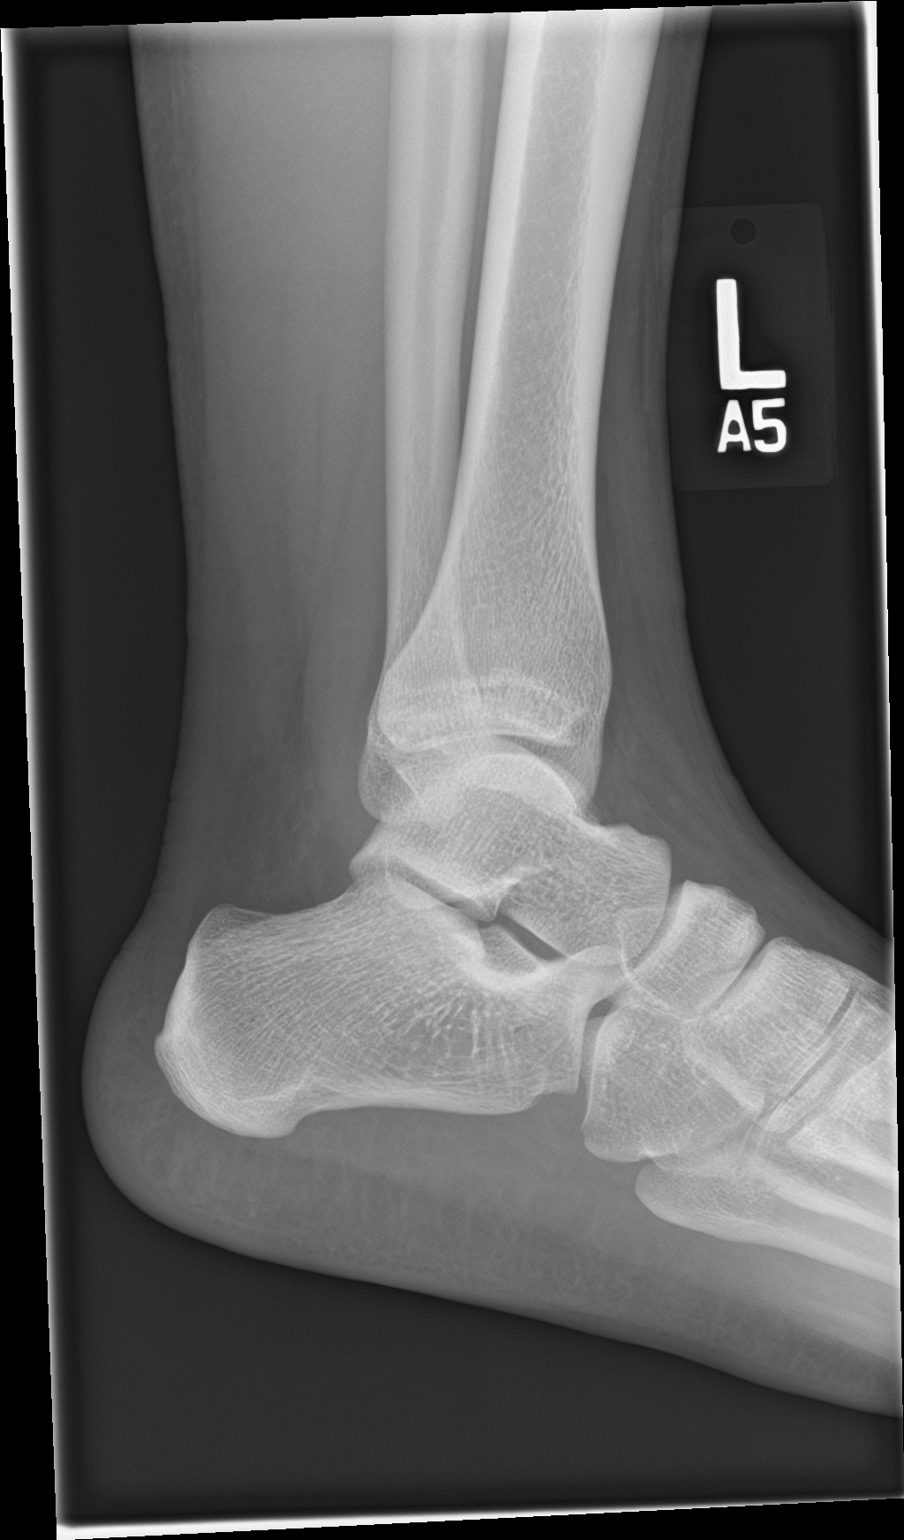

[3 of 3 positions shown; findings below may reference images not displayed]

FINDINGS: Osseous alignment is normal. Bone mineralization is normal. No
fracture line or displaced fracture fragment seen. Ankle mortise is
symmetric. Visualized portions of the hindfoot and midfoot appear
normal. Soft tissues about the left ankle are unremarkable.
IMPRESSION: Negative.

## 2019-08-25 ENCOUNTER — Encounter (HOSPITAL_COMMUNITY): Payer: Self-pay

## 2019-11-04 LAB — PREGNANCY, URINE: Preg Test, Ur: POSITIVE

## 2019-11-05 ENCOUNTER — Ambulatory Visit (HOSPITAL_COMMUNITY)
Admission: EM | Admit: 2019-11-05 | Discharge: 2019-11-05 | Disposition: A | Discharge status: Home / Self Care | Payer: Medicaid Other | Attending: Family Medicine | Admitting: Family Medicine

## 2019-11-05 ENCOUNTER — Encounter (HOSPITAL_COMMUNITY): Payer: Self-pay | Admitting: Emergency Medicine

## 2019-11-05 ENCOUNTER — Other Ambulatory Visit: Payer: Self-pay

## 2019-11-05 DIAGNOSIS — B9689 Other specified bacterial agents as the cause of diseases classified elsewhere: Secondary | ICD-10-CM

## 2019-11-05 DIAGNOSIS — Z3A01 Less than 8 weeks gestation of pregnancy: Secondary | ICD-10-CM | POA: Insufficient documentation

## 2019-11-05 DIAGNOSIS — N760 Acute vaginitis: Secondary | ICD-10-CM

## 2019-11-05 LAB — POCT URINALYSIS DIP (DEVICE)
Bilirubin Urine: NEGATIVE
Glucose, UA: NEGATIVE mg/dL
Hgb urine dipstick: NEGATIVE
Ketones, ur: NEGATIVE mg/dL
Nitrite: NEGATIVE
Protein, ur: NEGATIVE mg/dL
Specific Gravity, Urine: 1.025 (ref 1.005–1.030)
Urobilinogen, UA: 0.2 mg/dL (ref 0.0–1.0)
pH: 7 (ref 5.0–8.0)

## 2019-11-05 MED ORDER — CLOTRIMAZOLE 1 % VA CREA: 1.0000 | TOPICAL_CREAM | Freq: Every day | VAGINAL | 0 refills | Status: AC

## 2019-11-05 NOTE — Discharge Instructions (Signed)
Begin clotrimazole vaginal cream daily for 1 week Please call OBGYN and set up prenatal appoitnment  We are testing you for Gonorrhea, Chlamydia, Trichomonas, Yeast and Bacterial Vaginosis. We will call you if anything is positive and let you know if you require any further treatment. Please inform partners of any positive results.   Please return if symptoms not improving with treatment, development of fever, nausea, vomiting, abdominal pain.

## 2019-11-05 NOTE — ED Provider Notes (Signed)
[facility]    CSN: [id number] Arrival date & time: 11/05/19  1723      History   Chief Complaint Chief Complaint  Patient presents with  . Vaginal Itching    HPI Eileen Melton is a 23 y.o. female approximately [redacted] weeks pregnant presenting today for evaluation of vaginal irritation and discharge.  Patient states that over the past week she has felt her genital area become itchy and inflamed.  Feels a lot of irritation and notes that it has become more red.  She has had a small amount of discharge.  Feels slightly similar to previous yeast infections, but does not have as much discharge as normal.  She denies any abdominal pain or pelvic pain.  Denies any urinary symptoms of dysuria, increased frequency or urgency.  She denies fevers, nausea or vomiting.  HPI  History reviewed. No pertinent past medical history.  Patient Active Problem List   Diagnosis Date Noted  . Supervision of low-risk pregnancy 10/21/2018  . Marijuana use 10/21/2018    Past Surgical History:  Procedure Laterality Date  . WISDOM TOOTH EXTRACTION      OB History    Gravida  2   Para  0   Term  0   Preterm  0   AB  0   Living  0     SAB  0   TAB  0   Ectopic  0   Multiple  0   Live Births  0            Home Medications    Prior to Admission medications   Medication Sig Start Date End Date Taking? Authorizing Provider  clotrimazole (GYNE-LOTRIMIN) 1 % vaginal cream Place 1 Applicatorful vaginally at bedtime for 7 days. 11/05/19 11/12/19  Wieters, Hallie C, PA-C  Prenatal Vit-Fe Fumarate-FA (MULTIVITAMIN-PRENATAL) 27-0.8 MG TABS tablet Take 1 tablet by mouth daily at 12 noon.    [provider]    Family History Family History  Problem Relation Age of Onset  . Aneurysm Mother   . Cancer Maternal Grandmother     Social History Social History   Tobacco Use  . Smoking status: Never Smoker  . Smokeless tobacco: Never Used  Substance Use Topics  .  Alcohol use: Not Currently  . Drug use: Yes    Types: Marijuana     Allergies   Hydrocodone-acetaminophen and Kiwi extract   Review of Systems Review of Systems  Constitutional: Negative for fever.  Respiratory: Negative for shortness of breath.   Cardiovascular: Negative for chest pain.  Gastrointestinal: Negative for abdominal pain, diarrhea, nausea and vomiting.  Genitourinary: Positive for vaginal discharge. Negative for dysuria, flank pain, genital sores, hematuria, menstrual problem, vaginal bleeding and vaginal pain.  Musculoskeletal: Negative for back pain.  Skin: Negative for rash.  Neurological: Negative for dizziness, light-headedness and headaches.     Physical Exam Triage Vital Signs ED Triage Vitals  Enc Vitals Group     BP 11/05/19 1754 129/83     Pulse Rate 11/05/19 1754 83     Resp 11/05/19 1754 16     Temp 11/05/19 1754 98.6 F (37 C)     Temp src --      SpO2 11/05/19 1754 100 %     Weight --      Height --      Head Circumference --      Peak Flow --      Pain Score 11/05/19 1756 0  Pain Loc --      Pain Edu? --      Excl. in GC? --    No data found.  Updated Vital Signs BP 129/83   Pulse 83   Temp 98.6 F (37 C)   Resp 16   SpO2 100%   Visual Acuity Right Eye Distance:   Left Eye Distance:   Bilateral Distance:    Right Eye Near:   Left Eye Near:    Bilateral Near:     Physical Exam Vitals signs and nursing note reviewed.  Constitutional:      Appearance: She is well-developed.     Comments: No acute distress  HENT:     Head: Normocephalic and atraumatic.     Nose: Nose normal.  Eyes:     Conjunctiva/sclera: Conjunctivae normal.  Neck:     Musculoskeletal: Neck supple.  Cardiovascular:     Rate and Rhythm: Normal rate.  Pulmonary:     Effort: Pulmonary effort is normal. No respiratory distress.  Abdominal:     General: There is no distension.     Comments: Abdomen soft, nondistended, nontender to light and deep  palpation throughout entire abdomen  Genitourinary:    Comments: Normal external female genitalia, no rashes or lesions noted externally, small amount of irritated and erythematous mucosa noted at introitus, vaginal mucosa pink, large amount of white discharge present, discharge thicker in areas, cervix without erythema; no cervical motion tenderness or adnexal tenderness Musculoskeletal: Normal range of motion.  Skin:    General: Skin is warm and dry.  Neurological:     Mental Status: She is alert and oriented to person, place, and time.      UC Treatments / Results  Labs (all labs ordered are listed, but only abnormal results are displayed) Labs Reviewed  POCT URINALYSIS DIP (DEVICE) - Abnormal; Notable for the following components:      Result Value   Leukocytes,Ua SMALL (*)    All other components within normal limits  URINE CULTURE  CERVICOVAGINAL ANCILLARY ONLY    EKG   Radiology No results found.  Procedures Procedures (including critical care time)  Medications Ordered in UC Medications - No data to display  Initial Impression / Assessment and Plan / UC Course  I have reviewed the triage vital signs and the nursing notes.  Pertinent labs & imaging results that were available during my care of the patient were reviewed by me and considered in my medical decision making (see chart for details).     Based off exam symptoms likely yeast.  Will treat with clotrimazole vaginal cream topically given patient is pregnant.  Avoiding further treatment to avoid any unnecessary medicines during pregnancy.  Set up prenatal visit with OB/GYN.  Small leuks on UA, will send for culture to check for asymptomatic bacteriuria in pregnancy.  Discussed strict return precautions. Patient verbalized understanding and is agreeable with plan.  Final Clinical Impressions(s) / UC Diagnoses   Final diagnoses:  Vaginitis and vulvovaginitis  Less than [redacted] weeks gestation of pregnancy      Discharge Instructions     Begin clotrimazole vaginal cream daily for 1 week Please call OBGYN and set up prenatal appoitnment  We are testing you for Gonorrhea, Chlamydia, Trichomonas, Yeast and Bacterial Vaginosis. We will call you if anything is positive and let you know if you require any further treatment. Please inform partners of any positive results.   Please return if symptoms not improving with treatment, development of fever,  nausea, vomiting, abdominal pain.    ED Prescriptions    Medication Sig Dispense Auth. Provider   clotrimazole (GYNE-LOTRIMIN) 1 % vaginal cream Place 1 Applicatorful vaginally at bedtime for 7 days. 45 g Wieters, [place] C, PA-C     PDMP not reviewed this encounter.   Janith Lima, [place] 11/05/19 1943

## 2019-11-05 NOTE — ED Triage Notes (Signed)
Pt c/o vaginal irritation x1 week, some mild discharge. Pt states she is [redacted] weeks pregnant.

## 2019-11-06 LAB — URINE CULTURE: Culture: NO GROWTH

## 2019-11-10 LAB — CERVICOVAGINAL ANCILLARY ONLY
Bacterial vaginitis: NEGATIVE
Candida vaginitis: POSITIVE — AB
Chlamydia: NEGATIVE
Neisseria Gonorrhea: NEGATIVE
Trichomonas: NEGATIVE

## 2019-11-20 ENCOUNTER — Encounter: Payer: Self-pay | Admitting: General Practice

## 2019-12-02 ENCOUNTER — Encounter: Payer: Self-pay | Admitting: *Deleted

## 2019-12-10 ENCOUNTER — Encounter: Payer: Self-pay | Admitting: *Deleted

## 2019-12-10 ENCOUNTER — Other Ambulatory Visit: Payer: Self-pay

## 2019-12-10 ENCOUNTER — Ambulatory Visit (INDEPENDENT_AMBULATORY_CARE_PROVIDER_SITE_OTHER): Payer: Medicaid Other | Admitting: *Deleted

## 2019-12-10 DIAGNOSIS — F419 Anxiety disorder, unspecified: Secondary | ICD-10-CM | POA: Insufficient documentation

## 2019-12-10 DIAGNOSIS — Z3490 Encounter for supervision of normal pregnancy, unspecified, unspecified trimester: Secondary | ICD-10-CM

## 2019-12-10 NOTE — Progress Notes (Signed)
I called  [facility] and asked them if we can get the [place] report and they confirmed she will need to sign a release first.  Tanetta Fuhriman,RN

## 2019-12-10 NOTE — BH Specialist Note (Signed)
Integrated Behavioral Health Initial Visit  Patient and/or legal guardian verbally consented to [facility] services about presenting concerns and psychiatric consultation as appropriate.   MRN: [id number] Name: Eileen Melton  Number of [place] Clinician visits:: 1/6 Session Start time: 2:54  Session End time: 3:51 Total time: 67  Type of Service: [place] Interpretor:No. Interpretor Name and Language: n/a   Warm Hand Off Completed.       SUBJECTIVE: Eileen Melton is a 23 y.o. female accompanied by n/a Patient was referred by Jorje Guild, NP for positive depression screen. Patient reports the following symptoms/concerns: Pt states her primary symptoms are sleep difficulty (attributed to roommates), fatigue, poor appetite, anxiety, worry, difficulty relaxing, restlessness, irritability; anxiety leads to pulling out her eyelashes and eyebrows, and experienced her first panic attack over one week ago.Pt copes best by spending time with her dog; her goal this week is to announce positive pregnancy to her family.  Duration of problem: Increase in pregnancy; Severity of problem: severe  OBJECTIVE: Mood: Anxious and Affect: Appropriate Risk of harm to self or others: No plan to harm self or others  LIFE CONTEXT: Family and Social: Pt lives with several roommates; expects family to be supportive School/Work: Pt is actively seeking employment Self-Care: Recognizing a greater need for self-care Life Changes: Current pregnancy  GOALS ADDRESSED: Patient will: 1. Reduce symptoms of: anxiety, depression and stress 2. Increase knowledge and/or ability of: healthy habits, self-management skills and stress reduction  3. Demonstrate ability to: Increase healthy adjustment to current life circumstances and Increase motivation to adhere to plan of care  INTERVENTIONS: Interventions utilized: Mindfulness or  Psychologist, educational, Psychoeducation and/or Health Education and Link to [employer]  Standardized Assessments completed: GAD-7 and PHQ 9  ASSESSMENT: Patient currently experiencing Anxiety disorder, unspecified.   Patient may benefit from psychoeducation and brief therapeutic interventions regarding coping with symptoms of anxiety and depressive symptoms .  PLAN: 1. Follow up with behavioral health clinician on : Two weeks 2. Behavioral recommendations:  -CALM relaxation breathing exercise twice daily (morning; at bedtime); prior to telling family about pregnancy -Begin Worry Time strategy tomorrow, and daily until follow up visit  -View [email or web address] for tips on safe baby/dog introductions -Read educational materials regarding coping with panic attacks  3. Referral(s): [place] ([facility] Clinic) and [employer]:  dog/baby introduction 4. "From scale of 1-10, how likely are you to follow plan?": 10  Garlan Fair, LCSW  Depression screen [facility] 2/9 12/24/2019 12/17/2019 12/10/2019 10/21/2018  Decreased Interest 1 1 0 0  Down, Depressed, Hopeless 1 1 0 0  PHQ - 2 Score 2 2 0 0  Altered sleeping 3 3 3  0  Tired, decreased energy 3 2 1 1   Change in appetite 3 3 3 1   Feeling bad or failure about yourself  0 0 0 0  Trouble concentrating 1 1 0 0  Moving slowly or fidgety/restless 2 2 0 0  Suicidal thoughts 0 0 0 0  PHQ-9 Score 14 13 7 2    GAD 7 : Generalized Anxiety Score 12/24/2019 12/17/2019 12/10/2019 10/21/2018  Nervous, Anxious, on Edge 3 3 3  0  Control/stop worrying 3 3 2 1   Worry too much - different things 3 3 2 1   Trouble relaxing 3 3 3  0  Restless 3 3 0 0  Easily annoyed or irritable 3 3 3 1   Afraid - awful might happen 0 0 0 0  Total GAD 7  Score 18 18 13  3

## 2019-12-10 NOTE — Patient Instructions (Signed)

## 2019-12-10 NOTE — Progress Notes (Signed)
I connected with  Eileen Melton on 12/10/19 at  9:30 AM EST by telephone and verified that I am speaking with the correct person using two identifiers.   I discussed the limitations, risks, security and privacy concerns of performing an evaluation and management service by telephone and the availability of in person appointments. I also discussed with the patient that there may be a patient responsible charge related to this service. The patient expressed understanding and agreed to proceed. Explained I am completing her New OB Intake today. We discussed Her EDD and that it is based on  sure LMP. She also reports she had an [place] at [facility] and thought it showed a different date. I explained I will call them and see if they can send the [place] report and also asked her to bring whatever they gave her with her to her first appointment.. I reviewed her allergies, meds, OB History, Medical /Surgical history, and appropriate screenings.She reports a history of having anxiety and depression sometimes but never being diagnosed. Her GAD 7 was + today and I offered her bhc appointment with Roselyn Reef and she accepted.  Patient and/or legal guardian verbally consented to [facility] services about presenting concerns and psychiatric consultation as appropriate.   I explained I will send her the [employer] app- app sent to her while on phone.She did not receive the email- I explained because she was sent it last year with previous pregnancy we will need to reach out to [employer] and have them send her the email in the next few days.   I explained we will have her take her blood pressure weekly. She confims she has a cuff at home she can use. I asked her to bring the blood pressure cuff with her to her first ob appointment so we can show her how to use it. Explained  then we will have her take her blood pressure weekly and enter into the app. Explained she will have some visits in office and some  virtually. I sent her a text to sign up for MyChart and assisted her with downloading app. I reviewed her new ob  appointment date/ time with her , our location and to wear mask, no visitors.  I explained she will have a pelvic exam, ob bloodwork, hemoglobin a1C, cbg , genetic testing if desired,- she does want a panorama,  pap if needed. I scheduled an [place] at 19 weeks and gave her the appointment. She voices understanding.   Clydean Posas,RN 12/10/2019  9:34 AM

## 2019-12-13 ENCOUNTER — Encounter: Payer: Self-pay | Admitting: Student

## 2019-12-13 NOTE — Progress Notes (Signed)
Chart reviewed for nurse visit. Agree with plan of care.   Jorje Guild, NP 12/13/2019 12:30 PM

## 2019-12-17 ENCOUNTER — Ambulatory Visit (INDEPENDENT_AMBULATORY_CARE_PROVIDER_SITE_OTHER): Payer: Medicaid Other | Admitting: Student

## 2019-12-17 ENCOUNTER — Encounter: Payer: Self-pay | Admitting: Student

## 2019-12-17 ENCOUNTER — Other Ambulatory Visit: Payer: Self-pay

## 2019-12-17 ENCOUNTER — Other Ambulatory Visit (HOSPITAL_COMMUNITY)
Admission: RE | Admit: 2019-12-17 | Discharge: 2019-12-17 | Disposition: A | Discharge status: Home / Self Care | Payer: Medicaid Other | Source: Ambulatory Visit | Attending: Student | Admitting: Student

## 2019-12-17 VITALS — BP 111/75 | HR 99 | Wt 143.6 lb

## 2019-12-17 DIAGNOSIS — F32A Depression, unspecified: Secondary | ICD-10-CM

## 2019-12-17 DIAGNOSIS — O99342 Other mental disorders complicating pregnancy, second trimester: Secondary | ICD-10-CM

## 2019-12-17 DIAGNOSIS — Z3491 Encounter for supervision of normal pregnancy, unspecified, first trimester: Secondary | ICD-10-CM | POA: Insufficient documentation

## 2019-12-17 DIAGNOSIS — F329 Major depressive disorder, single episode, unspecified: Secondary | ICD-10-CM

## 2019-12-17 DIAGNOSIS — Z3A14 14 weeks gestation of pregnancy: Secondary | ICD-10-CM

## 2019-12-17 MED ORDER — TERCONAZOLE 0.4 % VA CREA: 1.0000 | TOPICAL_CREAM | Freq: Every day | VAGINAL | 0 refills | Status: DC

## 2019-12-17 NOTE — Progress Notes (Signed)
  Subjective:    Eileen Melton is being seen today for her first obstetrical visit.  This is not a planned pregnancy. She is at [redacted]w[redacted]d gestation. Her obstetrical history is significant for nothing. . Relationship with FOB: significant other, living together. Patient does intend to breast feed. Pregnancy history fully reviewed.  Patient reports no complaints. She thought she had a yeast infection last week; she treated it with Monistat cream OTC and feels that her symptoms have mostly resolved.  Review of Systems:   Review of Systems  Constitutional: Negative.   HENT: Negative.   Respiratory: Negative.   Cardiovascular: Negative.   Genitourinary: Negative.   Neurological: Negative.     Objective:     BP 111/75   Pulse 99   Wt 143 lb 9.6 oz (65.1 kg)   LMP 09/07/2019 (Exact Date)   BMI 27.58 kg/m  Physical Exam  Constitutional: She is oriented to person, place, and time. She appears well-developed.  HENT:  Head: Normocephalic.  Respiratory: Effort normal.  GI: Soft.  Genitourinary:    Genitourinary Comments: NEFG: white clumpy discharge in the vagina, no CMT, suprapubic or adnexal tenderness.    Musculoskeletal:        General: Normal range of motion.     Cervical back: Normal range of motion.  Neurological: She is alert and oriented to person, place, and time.  Skin: Skin is warm and dry.    Exam    Assessment:    Pregnancy: G2P0010 Patient Active Problem List   Diagnosis Date Noted  . Supervision of low-risk pregnancy 12/10/2019  . Anxiety 12/10/2019  . Marijuana use 10/21/2018       Plan:     Initial labs drawn. Prenatal vitamins. Problem list reviewed and updated. AFP3 discussed: ordered. Role of ultrasound in pregnancy discussed; fetal survey: requested. Amniocentesis discussed: not indicated. Follow up in 4 weeks. 50% of 30 min visit spent on counseling and coordination of care.  -Patient has My Chart and her own blood pressure cuff; reviewed  warning signs and values for elevated BPs.  -Patient will create [employer] account; she will reset her account that she was given earlier.  -Pap today -Will send RX for Terazol -Genetic testing today -The nature of [place] with multiple MDs and other Advanced Practice Providers was explained to patient; also emphasized that residents, students are part of our team.  Mervyn Skeeters [facility] 12/17/2019

## 2019-12-17 NOTE — Progress Notes (Deleted)
Medicaid Form Completed-12/17/2019

## 2019-12-17 NOTE — Patient Instructions (Addendum)
-Blood pressure values to be worried about. Top number is 140 or higher (systolic number). Bottom number is 90 or higher (diastolic). It only takes one number to be concerning; if that happens, please give [place] a call.  -   Places to have your son circumcised:                                                                      [facility]     [phone] while you are in hospital         [facility]              [phone]   $269 by 4 wks                      [facility]                     [phone]   $269 by 7 days [facility]                    [phone]   $269 by 4 wks [facility]             [phone]   $225 by 2 wks    These prices sometimes change but are roughly what you can expect to pay. Please call and confirm pricing.   Circumcision is considered an elective/non-medically necessary procedure. There are many reasons parents decide to have their sons circumsized. During the first year of life circumcised males have a reduced risk of urinary tract infections but after this year the rates between circumcised males and uncircumcised males are the same.  It is safe to have your son circumcised outside of the hospital and the places above perform them regularly.   Deciding about Circumcision in Baby Boys  (Up-to-date The Basics)  What is circumcision?   Circumcision is a surgery that removes the skin that covers the tip of the penis, called the "foreskin" Circumcision is usually done when a boy is between 47 and 60 days old. In the [place], circumcision is common. In some other countries, fewer boys are circumcised. Circumcision is a common tradition in some religions.  Should I have my baby boy circumcised?   There is no easy answer. Circumcision has some benefits. But it also has risks. After talking with your doctor, you will have to decide for yourself what is right for your family.  What are the benefits of circumcision?   Circumcised boys seem to have slightly lower rates  of: ?Urinary tract infections ?Swelling of the opening at the tip of the penis Circumcised men seem to have slightly lower rates of: ?Urinary tract infections ?Swelling of the opening at the tip of the penis ?Penis cancer ?HIV and other infections that you catch during sex ?Cervical cancer in the women they have sex with Even so, in the [place], the risks of these problems are small - even in boys and men who have not been circumcised. Plus, boys and men who are not circumcised can reduce these extra risks by: ?Cleaning their penis well ?Using condoms during sex  What are the risks of circumcision?  Risks include: ?Bleeding or infection from the  surgery ?Damage to or amputation of the penis ?A chance that the doctor will cut off too much or not enough of the foreskin ?A chance that sex won't feel as good later in life Only about 1 out of every 200 circumcisions leads to problems. There is also a chance that your health insurance won't pay for circumcision.  How is circumcision done in baby boys?  First, the baby gets medicine for pain relief. This might be a cream on the skin or a shot into the base of the penis. Next, the doctor cleans the baby's penis well. Then he or she uses special tools to cut off the foreskin. Finally, the doctor wraps a bandage (called gauze) around the baby's penis. If you have your baby circumcised, his doctor or nurse will give you instructions on how to care for him after the surgery. It is important that you follow those instructions carefully. AREA PEDIATRIC/FAMILY PRACTICE PHYSICIANS  Central/[place]outheast [place] ([address]) . [facility] Melodie Bouillon, MD; Lum Babe, MD; Sheffield Slider, MD; Leveda Anna, MD; McDiarmid, MD; Jerene Bears, MD; Jennette Kettle, MD; Gwendolyn Grant, MD o [address] [place], [place] [address] o [phone] o Mon-Fri 8:30-12:30, 1:30-5:00 o Providers come to see babies at [facility] o Accepting Medicaid . [facility] [place] o Limited providers who accept newborns: Docia Chuck, MD; Kateri Plummer, MD; Paulino Rily, MD o [address], [place], [place] [address] o [phone] o Mon-Fri 8:00-5:30 o Babies seen by providers at [facility] o Does NOT accept Medicaid o Please call early in hospitalization for appointment (limited availability)  . [facility] Fatima Sanger, MD o [address] [place], [place] [address] o [phone] o Mon, Tue, Thur, Fri 8:30-5:00, Wed 10:00-7:00 (closed 1-2pm) o Babies seen by [facility] providers o Accepting Medicaid . Donnie Coffin - Pediatrician Fae Pippin, MD o [address], [place], [place] [address] o [phone] o Mon-Fri 8:30-5:00, Sat 8:30-12:00 o Provider comes to see babies at [facility] o Accepting Medicaid o Must have been referred from current patients or contacted office prior to delivery . [facility]) Leotis Pain, MD; Ave Filter, MD; Luna Fuse, MD; Kennedy Bucker, MD; Konrad Dolores, MD; Kathlene November, MD; Jenne Campus, MD; Lubertha South, MD; Wynetta Emery, MD; Duffy Rhody, MD; Gerre Couch, NP; Shirl Harris, NP o [address], [place], [place] [address] o [phone] o Mon, Tue, Thur, Fri 8:30-5:30, Wed 9:30-5:30, Sat 8:30-12:30 o Babies seen by [facility] providers o Accepting Medicaid o Only accepting infants of first-time parents or siblings of current patients [facility] discharge coordinator will make follow-up appointment . Cyril Mourning o [address] [place], [place]  [address] o [phone]   Fax - [phone] . [facility] o [address], [place], [place]  [address] o Phone - [phone]   Fax - [phone] . Lucio Edward o [address], [place], [place]  [address] o [phone]  East/Northeast [place] [phone]) . [facility] Jorge Mandril, MD; Alita Chyle, MD; Princella Ion, MD; MD; Earlene Plater, MD; Jamesetta Orleans, MD;  Alvera Novel, MD; Clarene Duke, MD; Rana Snare, MD; Carmon Ginsberg, MD; Alinda Money, MD; Hosie Poisson, MD; Mayford Knife, MD o [address] [place], [place] [address] o [phone] o Mon-Fri 8:30-5:00 (extended evenings Mon-Thur as needed), Sat-Sun 10:00-1:00 o Providers come to see babies at [facility] o Accepting Medicaid for families of first-time babies and families with all children in the household age 72 and under. Must register with office prior  to making appointment (M-F only). [facility] Odella Aquas, NP; Lynelle Doctor, MD; Susann Givens, MD; [place], [place] o [address], [place], [place] [address] o [phone] o Mon-Fri 8:00-5:00 o Babies seen by providers at [facility] o Does NOT accept Medicaid/Commercial Insurance Only . [facility] [place] ([facility])  Suzette Battiest, MD; Zachery Dauer, MD; Stefan Church, MD; Sabino Dick, MD; Quitman Livings, MD; Farris Has, MD; Gaynell Face, MD; Betha Loa, MD; Colon Flattery, MD; Clifton James, MD o [address] [place], [place] [address] o [phone] o Mon-Fri 8:30-5:30, Sat (Oct.-Mar.) 9:00-1:00 o Babies seen by providers at [facility] o Accepting [facility] [phone]) . [facility] Dimitri, MD; Sheliah Hatch, MD o [address], [place], [place] [address] o [phone] o Mon-Fri 8:30-5:00, Sat 8:30-12:00 o Providers come to see babies at [facility] o Does NOT accept Medicaid . [facility] Cindy Hazy, [place]; Britton, MD; [place], [place]; Wynelle Link, MD; Azucena Cecil, MD o [address], [place], [place] [address] o [phone] o Mon-Fri 8:00-5:00 o Babies seen by providers at [facility] o Does NOT accept Medicaid o Only accepting babies of parents who are patients o Please call early in hospitalization for appointment (limited availability) . [facility] Lamar Benes, MD; Abran Cantor, MD; Early Osmond, MD; Cherre Huger, NP; Hyacinth Meeker, MD; Dwan Bolt, MD; Jarold Motto, NP; Dario Guardian, MD; Talmage Nap, MD; Maisie Fus, MD;  Pricilla Holm, MD; Tama High, MD o [address] [place], [place], [place] [address] o [phone] o Mon-Fri 8:00-5:00, Sat 9:00-12:00 o Providers come to see babies at [facility] o Does NOT accept [facility] [phone]) . [facility] o Limited providers accepting new patients: Drema Pry, NP; [place], PA o [address] [place], [place] [address] o [phone] o Mon-Fri 8:00-5:00 o Babies seen by providers at [facility] o Does NOT accept Medicaid o Only accepting babies of parents who are patients o Please call early in hospitalization for appointment (limited availability) . [facility] Luan Pulling, MD; Nash Dimmer, MD o [address] [place]., [place], [place] [address] o [phone] (press 1 to schedule appointment) o Mon-Fri 8:00-5:00 o Providers come to see babies at [facility] o Does NOT accept Medicaid . [facility] Cristino Martes, MD o [address], [place], [place] [address] o [phone] o Mon-Fri 8:30-5:00 (lunch 12:30-1:00), extended hours by appointment only Wed 5:00-6:30 o Babies seen by [facility] providers o Accepting Medicaid . [facility] Abbot, MD; [place], MD; Hassan Rowan, MD o [address] [place], [place], [place] [address] o [phone] o Mon-Fri 8:00-5:00 o Babies seen by [facility] providers o Does NOT accept Medicaid . Nature conservation officer at [place] [address] Elsworth Soho, MD; Durene Cal, MD; [place], DO o [address] [place], [place] [address] o [phone] o Mon-Fri 8:00-5:00 o Babies seen by [facility] providers o Does NOT accept Medicaid . [facility] o [place], [place]; [place], [place]; [place], NP; Avis Epley, MD; Vonna Kotyk, MD; Clance Boll, MD; Stevphen Rochester, NP; Arvilla Market, NP; Ann Maki, NP; Otis Dials, NP; Vaughan Basta, MD; [place], MD o [address] [place], [place] [address] o [phone] o Mon-Fri 8:30-5:00, Sat 10:00-1:00 o Providers come to see babies at [facility] o Does NOT accept Medicaid o Free prenatal information session Tuesdays at 4:45pm . [facility] Luna Kitchens, MD; [place], [place]; [place], [place]; [place], [place] o [address] [place] [address] o [phone] o Mon-Fri 7:30-5:30 o Babies seen by [facility] providers . [facility]  Doctor o [address], [place], [place]  [address] o [phone]   Fax - [phone]  [place] [phone] & [phone]) . [facility] Alphonsa Overallo Reese, MD o [address] [place], [place] [address] o [phone] o Mon-Thur 8:00-6:00 o Providers come to see babies at [facility] o Accepting Medicaid . [facility] Zenon Mayoo Anderson, NP; Cyndia BentBadger, MD; [place], [place]; [place]pencer, [place] o [address] [place], [place] [address] o [phone] o Mon-Thur 7:30-7:30, Fri 7:30-4:30 o Babies seen by [facility] providers o Accepting Medicaid . [facility] Cheryle Horsfallo Agbuya, MD; Janene HarveyKlett, NP; Vonita Mossomgoolam, MD o [address], [place], [place] [address] o [phone] o Mon-Fri 8:30-5:00, Sat 8:30-12:00 o Providers come to see babies at [facility] o Accepting Medicaid o Must have "Meet & Greet" appointment at office prior to delivery . [facility] [place] [facility]) Llana Alimento McCord, MD; Earlene PlaterWallace, MD; Lucretia RoersWood, MD o [address], [place], [place] [address] o [phone] o Mon-Wed 8:00-6:00, Thur-Fri 8:00-5:00, Sat 9:00-12:00 o Providers come to see babies at [facility] o Does NOT accept Medicaid o Only accepting siblings of current patients . [facility]  o [address], [place], [place]  [address] o [phone]   Fax - [phone] . [facility] Jeanette o [phone], [place], [place]  [address] o [phone]   Fax - [phone]  [place] [phone] & [phone]) . Systems [facility]Bauer  [facility] o Dallas Centeririgliano, DO; DonahueMatthews, DO o [address] [place], [place] [address] o [phone] o Mon-Fri 7:00-5:00 o Babies seen by [facility] providers o Does NOT accept Medicaid . [facility] Ellis Savageo Briscoe, MD; [place], [place]; [place], [place] o [address], [place], [place] [address] o [phone] o Mon-Fri 8:00-5:00 o Babies seen by [facility] providers o Accepting Medicaid . [facility] - [address] Franne Fortso Boyd, MD; [place], [place]; Bluff CityJones, NP; Wanam[place], [place] o [address], [place], [place] [address] o [phone] o Mon-Fri 8:00-5:00 o Babies seen by providers at [facility] o Accepting [facility]  [place] [phone]) . [facility] [place], [place] o [address] [place], [place] [address] o [phone] o Mon-Fri 8:00-5:00 o Babies seen by [facility] providers o Does NOT accept Medicaid o Limited availability, please call early in hospitalization to schedule follow-up . [facility] Jolee Ewingo Calderon, PA; Eddie Candleummings, MD; [place] ParkDillard, MD; [place], [place]; Constance Goltzlson, MD; [place], [place] o [phone] [facility] [address], [place], [place] [address] o [phone] o Mon-Fri 8:30-5:00, Sat 9:00-12:00 o Babies seen by providers at [facility] o Accepting Medicaid o Please register online then schedule online or call office o [email or web address] . [facility]) Samuella Bruino Hunter, NP; Lucianne MussKumar, MD; Lanier ClamMartin Rogers, PA o [address], [place], [place] [address] o [phone] o Mon-Fri 8:00-5:00 o Babies seen by providers at [facility] o Accepting Medicaid . [facility] ([facility]) Sharin Monso Cayey, MD; Reed BreechKristi Fleenor, NP; Shelva MajesticWest, MD o [address], [place], [place] [address] o [phone] o Mon-Fri 8:00-5:30, Sat&Sun by  appointment (phones open at 8:30) o Babies seen by [facility] providers o Accepting Medicaid o Must be a first-time baby or sibling of current patient . [facility] [place]  o  [address], [place], [place]  [address] o [phone]   Fax - [phone]  [employer] [phone] & [phone]) . [place], [place]; [place], [place]; Benjamine Mola, MD; [place], [place]; Harrell Lark, MD o [address] [place], [place] [address] o [phone] o Mon-Thur 8:00-7:00, Fri 8:00-5:00, Sat 8:00-12:00, Sun 9:00-12:00 o Babies seen by [facility] providers o Accepting Medicaid . [facility] at [facility], MD; Ruthann Cancer, MD; [facility], MD o [address], [place], [place] [address] o [phone] o Mon-Thur 8:00-5:00 o Babies seen by providers at [facility] o Accepting Medicaid . [facility] at [facility], MD; Coe-Goins, MD; Amedeo Plenty, MD; Bobby Rumpf, MD; List, MD; [place] Drafts, MD; Ruthann Cancer, MD; Selinda Eon, MD; Audie Box, MD; Jim Like, MD; Christie Nottingham, MD; Hubbard Hartshorn, MD; Modena Nunnery, MD o [address] [place], [place] [address] o [phone] o Mon-Fri 8:00-5:30, Sat (Oct.-Mar.) 9:00-1:00 o Babies seen by providers at [facility] o Accepting Medicaid o Must fill out new patient packet, available online at [email or web address] . [place] ([facility]) Barnabas Lister, NP; [place]on Kingfisher, NP; Claiborne Billings, NP; Rolla Plate, MD; [place], [place]; Carola Rhine, MD; Tyron Russell, MD; Delia Chimes, NP o [address], [place], [place] [address] o [phone] o Mon-Thur 8:00-5:30, Fri 8:00-5:00 o Babies seen by providers at [place] [phone]) . [place], [place]; [place], MD; Dennard Schaumann, MD; [place], [place] o [address] [place], [place] [address] o [phone] o Mon-Fri 8:00-5:00 o Babies seen  by providers at [place] [phone]) . [place] at [place], [place]; Olen Pel, MD; [place], [place], [place], [place] [address] o [phone] o Mon-Fri 8:00-5:00 o Babies seen by providers at [facility] o Does NOT accept Medicaid o Limited appointment availability, please call early in hospitalization  . Therapist, music at [place], [place]; [place], [address] [place], [place] [address] o [phone] o Mon-Fri 8:00-5:00 o Babies seen by [facility] providers o Does NOT accept Medicaid . [facility] Su Grand, MD; Guy Sandifer, MD; [place], [place]; [place], [place], [place], [place] [address] o [phone] o Mon-Fri 8:00-5:00 o After hours clinic [facility] [place], [place] [address]) [phone] Mon-Fri 5:00-8:00, Sat 12:00-6:00, Sun 10:00-4:00 o Babies seen by [facility] providers o Accepting Medicaid . Tony at [facility] o [address], [place], [place]  [address] o [phone]   Fax - [phone]  [place] [phone]) . Therapist, music at [facility], MD o [address] [place], [place], [place] [address] o [phone] o Mon-Fri 8:00-5:00 o Babies seen by [facility] providers o Does NOT accept Medicaid . [place] ([place] [facility] [place]) Bing Neighbors, MD o [address], [place], [place] [address] o [phone] o Mon-Thur 8:00-7:00, Fri 8:00-5:00, Sat 8:00-12:00 o Babies seen by providers at [facility] o Accepting Medicaid - but does not have vaccinations in office (must be received elsewhere) o Limited availability, please call early in hospitalization  [place] ([phone]) . [place], [place] [address], [place] [address] o [phone]  Fax [phone]

## 2019-12-18 LAB — OBSTETRIC PANEL, INCLUDING HIV
Antibody Screen: NEGATIVE
Basophils Absolute: 0 10*3/uL (ref 0.0–0.2)
Basos: 0 %
EOS (ABSOLUTE): 0.1 10*3/uL (ref 0.0–0.4)
Eos: 1 %
HIV Screen 4th Generation wRfx: NONREACTIVE
Hematocrit: 37.5 % (ref 34.0–46.6)
Hemoglobin: 12 g/dL (ref 11.1–15.9)
Hepatitis B Surface Ag: NEGATIVE
Immature Grans (Abs): 0.1 10*3/uL (ref 0.0–0.1)
Immature Granulocytes: 1 %
Lymphocytes Absolute: 1 10*3/uL (ref 0.7–3.1)
Lymphs: 9 %
MCH: 24.3 pg — ABNORMAL LOW (ref 26.6–33.0)
MCHC: 32 g/dL (ref 31.5–35.7)
MCV: 76 fL — ABNORMAL LOW (ref 79–97)
Monocytes Absolute: 0.7 10*3/uL (ref 0.1–0.9)
Monocytes: 6 %
Neutrophils Absolute: 9.6 10*3/uL — ABNORMAL HIGH (ref 1.4–7.0)
Neutrophils: 83 %
Platelets: 261 10*3/uL (ref 150–450)
RBC: 4.94 x10E6/uL (ref 3.77–5.28)
RDW: 16.6 % — ABNORMAL HIGH (ref 11.7–15.4)
RPR Ser Ql: NONREACTIVE
Rh Factor: POSITIVE
Rubella Antibodies, IGG: 13.8 index (ref >0.99)
WBC: 11.4 10*3/uL — ABNORMAL HIGH (ref 3.4–10.8)

## 2019-12-18 LAB — CYTOLOGY - PAP
Chlamydia: NEGATIVE
Comment: NEGATIVE
Comment: NORMAL
Diagnosis: NEGATIVE
Neisseria Gonorrhea: NEGATIVE

## 2019-12-19 LAB — URINE CULTURE, OB REFLEX

## 2019-12-19 LAB — CULTURE, OB URINE

## 2019-12-19 NOTE — L&D Delivery Note (Signed)
OB/GYN Faculty Practice Delivery Note  Eileen Melton is a 24 y.o. G2P0010 s/p NSVD at [redacted]w[redacted]d. She was admitted for SOL.   ROM: 6h 20m with clear fluid GBS Status: negative Maximum Maternal Temperature: 99.5*F  Labor Progress: Admitted in latent labor. She received one dose of cytotec for augmentation. She SROMed and progressed to 9 cm, then required pitocin for augmentation. She progressed to complete, and delivered after a normal second stage.  Delivery Date/Time: 06/28/20, 2446 Delivery: Called to room and patient was complete and pushing. Head delivered OA. Nuchal cord x1, which was delivered through. Shoulder and body delivered in usual fashion. Infant with spontaneous cry, placed on mother's abdomen, dried and stimulated. Cord clamped x 2 after 1-minute delay, and cut by FOB under my direct supervision. Cord blood drawn. Placenta delivered spontaneously with gentle cord traction. Fundus firm with massage and Pitocin. Labia, perineum, vagina, and cervix were inspected, and a second degree was noted- repaired with 3-0 Monocryl in the usual fashion.   Placenta: 3 vessel cord, intact, patient desires to take it home with her Complications: None Lacerations: 2nd degree- repaired with 3-0 Monocryl in the usual fashion EBL: 111 mL Analgesia: 1% lidocaine for perineal repair  Postpartum Planning [x]  message to sent to schedule follow-up  [x]  vaccines UTD  Infant: female   APGARs 8, 9   2829 g  , DO OB/GYN Fellow, Faculty Practice

## 2019-12-24 ENCOUNTER — Other Ambulatory Visit: Payer: Self-pay

## 2019-12-24 ENCOUNTER — Ambulatory Visit (INDEPENDENT_AMBULATORY_CARE_PROVIDER_SITE_OTHER): Payer: Medicaid Other | Admitting: Clinical

## 2019-12-24 DIAGNOSIS — F419 Anxiety disorder, unspecified: Secondary | ICD-10-CM | POA: Diagnosis not present

## 2019-12-24 NOTE — Patient Instructions (Addendum)
/Emotional Wellbeing Apps and Websites Here are a few free apps meant to help you to help yourself.  To find, try searching on the internet to see if the app is offered on Apple/Android devices. If your first choice doesn't come up on your device, the good news is that there are many choices! Play around with different apps to see which ones are helpful to you.    Calm This is an app meant to help increase calm feelings. Includes info, strategies, and tools for tracking your feelings.      Calm Harm  This app is meant to help with self-harm. Provides many 5-minute or 15-min coping strategies for doing instead of hurting yourself.       Healthy Minds Health Minds is a problem-solving tool to help deal with emotions and cope with stress you encounter wherever you are.      MindShift This app can help people cope with anxiety. Rather than trying to avoid anxiety, you can make an important shift and face it.      MY3  MY3 features a support system, safety plan and resources with the goal of offering a tool to use in a time of need.       My Life My Voice  This mood journal offers a simple solution for tracking your thoughts, feelings and moods. Animated emoticons can help identify your mood.       Relax Melodies Designed to help with sleep, on this app you can mix sounds and meditations for relaxation.      Smiling Mind Smiling Mind is meditation made easy: it's a simple tool that helps put a smile on your mind.        Stop, Breathe & Think  A friendly, simple guide for people through meditations for mindfulness and compassion.  Stop, Breathe and Think Kids Enter your current feelings and choose a "mission" to help you cope. Offers videos for certain moods instead of just sound recordings.       Team Orange The goal of this tool is to help teens change how they think, act, and react. This app helps you focus on your own good feelings and experiences.      The  Virtual Hope Box The Virtual Hope Box (VHB) contains simple tools to help patients with coping, relaxation, distraction, and positive thinking.    Coping with Panic Attacks   What is a panic attack?  You may have had a panic attack if you experienced four or more of the symptoms listed below coming on abruptly and peaking in about 10 minutes.  Panic Symptoms   . Pounding heart  . Sweating  . Trembling or shaking  . Shortness of breath  . Feeling of choking  . Chest pain  . Nausea or abdominal distress    . Feeling dizzy, unsteady, lightheaded, or faint  . Feelings of unreality or being detached from yourself  . Fear of losing control or going crazy  . Fear of dying  . Numbness or tingling  . Chills or hot flashes      Panic attacks are sometimes accompanied by avoidance of certain places or situations. These are often situations that would be difficult to escape from or in which help might not be available. Examples might include crowded shopping malls, public transportation, restaurants, or driving.   Why do panic attacks occur?   Panic attacks are the body's alarm system gone awry. All of us have a built-in alarm system,   powered by adrenaline, which increases our heart rate, breathing, and blood flow in response to danger. Ordinarily, this 'danger response system' works well. In some people, however, the response is either out of proportion to whatever stress is going on, or may come out of the blue without any stress at all.   For example, if you are walking in the woods and see a bear coming your way, a variety of changes occur in your body to prepare you to either fight the danger or flee from the situation. Your heart rate will increase to get more blood flow around your body, your breathing rate will quicken so that more oxygen is available, and your muscles will tighten in order to be ready to fight or run. You may feel nauseated as blood flow leaves your stomach area and moves  into your limbs. These bodily changes are all essential to helping you survive the dangerous situation. After the danger has passed, your body functions will begin to go back to normal. This is because your body also has a system for "recovering" by bringing your body back down to a normal state when the danger is over.   As you can see, the emergency response system is adaptive when there is, in fact, a "true" or "real" danger (e.g., bear). However, sometimes people find that their emergency response system is triggered in "everyday" situations where there really is no true physical danger (e.g., in a meeting, in the grocery store, while driving in normal traffic, etc.).   What triggers a panic attack?  Sometimes particularly stressful situations can trigger a panic attack. For example, an argument with your spouse or stressors at work can cause a stress response (activating the emergency response system) because you perceive it as threatening or overwhelming, even if there is no direct risk to your survival.  Sometimes panic attacks don't seem to be triggered by anything in particular- they may "come out of the blue". Somehow, the natural "fight or flight" emergency response system has gotten activated when there is no real danger. Why does the body go into "emergency mode" when there is no real danger?   Often, people with panic attacks are frightened or alarmed by the physical sensations of the emergency response system. First, unexpected physical sensations are experienced (tightness in your chest or some shortness of breath). This then leads to feeling fearful or alarmed by these symptoms ("Something's wrong!", "Am I having a heart attack?", "Am I going to faint?") The mind perceives that there is a danger even though no real danger exists. This, in turn, activates the emergency response system ("fight or flight"), leading to a "full blown" panic attack. In summary, panic attacks occur when we  misinterpret physical symptoms as signs of impending death, craziness, loss of control, embarrassment, or fear of fear. Sometimes you may be aware of thoughts of danger that activate the emergency response system (for example, thinking "I'm having a heart attack" when you feel chest pressure or increased heart rate). At other times, however, you may not be aware of such thoughts. After several incidences of being afraid of physical sensations, anxiety and panic can occur in response to the initial sensations without conscious thoughts of danger. Instead, you just feel afraid or alarmed. In other words, the panic or fear may seem to occur "automatically" without you consciously telling yourself anything.   After having had one or more panic attacks, you may also become more focused on what is going on inside   your body. You may scan your body and be more vigilant about noticing any symptoms that might signal the start of a panic attack. This makes it easier for panic attacks to happen again because you pick up on sensations you might otherwise not have noticed, and misinterpret them as something dangerous. A panic attack may then result.      How do I cope with panic attacks?  An important part of overcoming panic attacks involves re-interpreting your body's physical reactions and teaching yourself ways to decrease the physical arousal. This can be done through practicing the cognitive and behavioral interventions below.   Research has found that over half of people who have panic attacks show some signs of hyperventilation or overbreathing. This can produce initial sensations that alarm you and lead to a panic attack. Overbreathing can also develop as part of the panic attack and make the symptoms worse. When people hyperventilate, certain blood vessels in the body become narrower. In particular, the brain may get slightly less oxygen. This can lead to the symptoms of dizziness, confusion, and  lightheadedness that often occur during panic attacks. Other parts of the body may also get a bit less oxygen, which may lead to numbness or tingling in the hands or feet or the sensation of cold, clammy hands. It also may lead the heart to pump harder. Although these symptoms may be frightening and feel unpleasant, it is important to remember that hyperventilating is not dangerous. However, you can help overcome the unpleasantness of overbreathing by practicing Breathing Retraining.   Practice this basic technique three times a day, every day:  . Inhale. With your shoulders relaxed, inhale as slowly and deeply as you can while you count to six. If you can, use your diaphragm to fill your lungs with air.  . Hold. Keep the air in your lungs as you slowly count to four.  . Exhale. Slowly breath out as you count to six.  . Repeat. Do the inhale-hold-exhale cycle several times. Each time you do it, exhale for longer counts.  Like any new skill, Breathing Retraining requires practice. Try practicing this skill twice a day for several minutes. Initially, do not try this technique in specific situations or when you become frightened or have a panic attack. Begin by practicing in a quiet environment to build up your skill level so that you can later use it in time of "emergency."   2. Decreasing Avoidance  Regardless of whether you can identify why you began having panic attacks or whether they seemed to come out of the blue, the places where you began having panic attacks often can become triggers themselves. It is not uncommon for individuals to begin to avoid the places where they have had panic attacks. Over time, the individual may begin to avoid more and more places, thereby decreasing their activities and often negatively impacting their quality of life. To break the cycle of avoidance, it is important to first identify the places or situations that are being avoided, and then to do some "relearning."  To  begin this intervention, first create a list of locations or situations that you tend to avoid. Then choose an avoided location or situation that you would like to target first. Now develop an "exposure hierarchy" for this situation or location. An "exposure hierarchy" is a list of actions that make you feel anxious in this situation. Order these actions from least to most anxiety-producing. It is often helpful to have the first item   on your hierarchy involve thinking or imagining part of the feared/avoided situation.   Here is an example of an exposure hierarchy for decreasing avoidance of the grocery store. Note how it is ordered from the least amount of anxiety (at the top) to the most anxiety (at the bottom):  . Think about going to the grocery store alone.  . Go to the grocery store with a friend or family member.  . Go to the grocery store alone to pick up a few small items (5-10 minutes in the store).  . Shopping for 10-20 minutes in the store alone.  . Doing the shopping for the week by myself (20-30 minutes in the store).   Your homework is to "expose" yourself to the lowest item on your hierarchy and use your breathing relaxation and coping statements (see below) to help you remain in the situation. Practice this several times during the upcoming week. Once you have mastered each item with minimal anxiety, move on to the next higher action on your list.   Cognitive Interventions  1. Identify your negative self-talk Anxious thoughts can increase anxiety symptoms and panic. The first step in changing anxious thinking is to identify your own negative, alarming self-talk. Some common alarming thoughts:  . I'm having a heart attack.            . I must be going crazy. . I think I'm dying. . People will think I'm crazy. . I'm going to pass our.  . Oh no- here it comes.  . I can't stand this.  . I've got to get out of here!  2. Use positive coping statements Changing or disrupting a  pattern of anxious thoughts by replacing them with more calming or supportive statements can help to divert a panic attack. Some common helpful coping statements:  . This is not an emergency.  . I don't like feeling this way, but I can accept it.  . I can feel like this and still be okay.  . This has happened before, and I was okay. I'll be okay this time, too.  . I can be anxious and still deal with this situation.        

## 2019-12-31 ENCOUNTER — Encounter: Payer: Self-pay | Admitting: *Deleted

## 2020-01-05 NOTE — BH Specialist Note (Signed)
Integrated Behavioral Health via Telemedicine Video Visit  01/05/2020 BERNECE GALL [id number]  Number of Integrated Behavioral Health visits: 2 (3 total) Session Start time: 3:56  Session End time: 4:02 Total time: 6  Referring Provider: Judeth Horn, NP Type of Visit: Video Patient/Family location: Home [facility] Provider location: [facility] All persons participating in visit: Patient Nancey Kreitz and [facility] Falisha Osment    Confirmed patient's address: Yes  Confirmed patient's phone number: Yes  Any changes to demographics: No   Confirmed patient's insurance: Yes  Any changes to patient's insurance: No   Discussed confidentiality: At previous visit  I connected with Garvin Fila  by a video enabled telemedicine application and verified that I am speaking with the correct person using two identifiers.     I discussed the limitations of evaluation and management by telemedicine and the availability of in person appointments.  I discussed that the purpose of this visit is to provide behavioral health care while limiting exposure to the novel coronavirus.   Discussed there is a possibility of technology failure and discussed alternative modes of communication if that failure occurs.  I discussed that engaging in this video visit, they consent to the provision of behavioral healthcare and the services will be billed under their insurance.  Patient and/or legal guardian expressed understanding and consented to video visit: Yes   PRESENTING CONCERNS: Patient and/or family reports the following symptoms/concerns: Pt states her primary goal today is to move into a new home in the next few weeks, and to obtain a notebook to write down appointments. Pt is using relaxation breathing daily,  and plans to tell family about pregnancy this weekend.  Duration of problem: Current pregnancy; Severity of problem: severe  STRENGTHS (Protective Factors/Coping Skills): Daily relaxation breathing;  positive outlook  GOALS ADDRESSED: Patient will: 1.  Reduce symptoms of: anxiety, depression and stress  2.  Increase knowledge and/or ability of: self-management skills  3.  Demonstrate ability to: Increase healthy adjustment to current life circumstances and Increase motivation to adhere to plan of care  INTERVENTIONS: Interventions utilized:  Supportive Counseling Standardized Assessments completed: Not given today  ASSESSMENT: Patient currently experiencing Anxiety disorder, unspecified.   Patient may benefit from psychoeducation and brief therapeutic interventions regarding coping with symptoms of anxiety and depression .  PLAN: 1. Follow up with behavioral health clinician on : Three weeks 2. Behavioral recommendations:  -Continue using relaxation breathing exercise twice daily(morning; bedtime) -Obtain a notebook asap; begin using notebook to implement daily Worry Time strategy. Write down scheduled appointments as part of daily worries.  -Continue plan to finish packing and move to new home -Continue plan to talk to family this weekend about current pregnancy  3. Referral(s): Integrated [employer] (In Clinic)  I discussed the assessment and treatment plan with the patient and/or parent/guardian. They were provided an opportunity to ask questions and all were answered. They agreed with the plan and demonstrated an understanding of the instructions.   They were advised to call back or seek an in-person evaluation if the symptoms worsen or if the condition fails to improve as anticipated.  Valetta Close Joon Pohle  Depression screen [facility] 2/9 12/24/2019 12/17/2019 12/10/2019 10/21/2018  Decreased Interest 1 1 0 0  Down, Depressed, Hopeless 1 1 0 0  PHQ - 2 Score 2 2 0 0  Altered sleeping 3 3 3  0  Tired, decreased energy 3 2 1 1   Change in appetite 3 3 3 1   Feeling bad or failure  about yourself  0 0 0 0  Trouble concentrating 1 1 0 0  Moving slowly or  fidgety/restless 2 2 0 0  Suicidal thoughts 0 0 0 0  PHQ-9 Score 14 13 7 2    GAD 7 : Generalized Anxiety Score 12/24/2019 12/17/2019 12/10/2019 10/21/2018  Nervous, Anxious, on Edge 3 3 3  0  Control/stop worrying 3 3 2 1   Worry too much - different things 3 3 2 1   Trouble relaxing 3 3 3  0  Restless 3 3 0 0  Easily annoyed or irritable 3 3 3 1   Afraid - awful might happen 0 0 0 0  Total GAD 7 Score 18 18 13  3

## 2020-01-06 ENCOUNTER — Other Ambulatory Visit: Payer: Self-pay

## 2020-01-06 ENCOUNTER — Ambulatory Visit (INDEPENDENT_AMBULATORY_CARE_PROVIDER_SITE_OTHER): Payer: Medicaid Other | Admitting: Clinical

## 2020-01-06 DIAGNOSIS — F419 Anxiety disorder, unspecified: Secondary | ICD-10-CM | POA: Diagnosis not present

## 2020-01-07 ENCOUNTER — Encounter: Payer: Self-pay | Admitting: *Deleted

## 2020-01-14 ENCOUNTER — Other Ambulatory Visit: Payer: Self-pay

## 2020-01-14 ENCOUNTER — Telehealth (INDEPENDENT_AMBULATORY_CARE_PROVIDER_SITE_OTHER): Payer: Medicaid Other | Admitting: Obstetrics & Gynecology

## 2020-01-14 DIAGNOSIS — Z3492 Encounter for supervision of normal pregnancy, unspecified, second trimester: Secondary | ICD-10-CM

## 2020-01-14 DIAGNOSIS — Z3A18 18 weeks gestation of pregnancy: Secondary | ICD-10-CM

## 2020-01-14 NOTE — Progress Notes (Signed)
I connected with  Eileen Melton on 01/14/20 at  4:15 PM EST by telephone and verified that I am speaking with the correct person using two identifiers.   I discussed the limitations, risks, security and privacy concerns of performing an evaluation and management service by telephone and the availability of in person appointments. I also discussed with the patient that there may be a patient responsible charge related to this service. The patient expressed understanding and agreed to proceed.  Ernestina Patches, CMA 01/14/2020  3:35 PM

## 2020-01-14 NOTE — Progress Notes (Signed)
   TELEHEALTH OBSTETRICS PRENATAL VIRTUAL VIDEO VISIT ENCOUNTER NOTE  Provider location: [facility] [place]   I connected with Garvin Fila on 01/14/20 at  4:15 PM EST by MyChart Video Encounter at home and verified that I am speaking with the correct person using two identifiers.   I discussed the limitations, risks, security and privacy concerns of performing an evaluation and management service virtually and the availability of in person appointments. I also discussed with the patient that there may be a patient responsible charge related to this service. The patient expressed understanding and agreed to proceed. Subjective:  Eileen Melton is a 24 y.o. G2P0010 at [redacted]w[redacted]d being seen today for ongoing prenatal care.  She is currently monitored for the following issues for this low-risk pregnancy and has Marijuana use; Supervision of low-risk pregnancy; and Anxiety on their problem list.  Patient reports no complaints.  Contractions: Not present. Vag. Bleeding: None.  Movement: Absent. Denies any leaking of fluid.   The following portions of the patient's history were reviewed and updated as appropriate: allergies, current medications, past family history, past medical history, past social history, past surgical history and problem list.   Objective:  There were no vitals filed for this visit.  Fetal Status:     Movement: Absent     General:  Alert, oriented and cooperative. Patient is in no acute distress.  Respiratory: Normal respiratory effort, no problems with respiration noted  Mental Status: Normal mood and affect. Normal behavior. Normal judgment and thought content.  Rest of physical exam deferred due to type of encounter  Imaging: No results found.  Assessment and Plan:  Pregnancy: G2P0010 at [redacted]w[redacted]d 1. Encounter for supervision of low-risk pregnancy in second trimester - I have encouraged her to sign up for [employer] - I encouraged her to take online  prenatal classes Preterm labor symptoms and general obstetric precautions including but not limited to vaginal bleeding, contractions, leaking of fluid and fetal movement were reviewed in detail with the patient. I discussed the assessment and treatment plan with the patient. The patient was provided an opportunity to ask questions and all were answered. The patient agreed with the plan and demonstrated an understanding of the instructions. The patient was advised to call back or seek an in-person office evaluation/go to [facility] at [facility] for any urgent or concerning symptoms. Please refer to After Visit Summary for other counseling recommendations.   I provided 10 minutes of face-to-face time during this encounter.  No follow-ups on file.  Future Appointments  Date Time Provider Department Center  01/14/2020  4:15 PM Allie Bossier, MD [facility]  01/19/2020  9:30 AM [facility] [place] 1 [facility]  01/26/2020  8:15 AM [facility]    Elwin Tsou Inda Coke, MD [facility], [place] [facility]

## 2020-01-19 ENCOUNTER — Other Ambulatory Visit: Payer: Self-pay

## 2020-01-19 ENCOUNTER — Ambulatory Visit (HOSPITAL_COMMUNITY)
Admission: RE | Admit: 2020-01-19 | Discharge: 2020-01-19 | Disposition: A | Discharge status: Home / Self Care | Payer: Medicaid Other | Source: Ambulatory Visit | Attending: Student | Admitting: Student

## 2020-01-19 ENCOUNTER — Other Ambulatory Visit: Payer: Self-pay | Admitting: Student

## 2020-01-19 DIAGNOSIS — Z3490 Encounter for supervision of normal pregnancy, unspecified, unspecified trimester: Secondary | ICD-10-CM

## 2020-01-19 DIAGNOSIS — Z3A18 18 weeks gestation of pregnancy: Secondary | ICD-10-CM

## 2020-01-20 ENCOUNTER — Other Ambulatory Visit (HOSPITAL_COMMUNITY): Payer: Self-pay | Admitting: *Deleted

## 2020-01-20 DIAGNOSIS — Z362 Encounter for other antenatal screening follow-up: Secondary | ICD-10-CM

## 2020-01-23 NOTE — BH Specialist Note (Signed)
Integrated Behavioral Health via Telemedicine Video Visit  01/23/2020 Eileen Melton [id number]  Number of [place] visits: 3(4 total) Session Start time: 8:17  Session End time: 8:40 Total time: 23  Referring Provider: Jorje Guild, NP Type of Visit: Video Patient/Family location: Home [facility] [facility] Provider location: [facility] All persons participating in visit: Patient Eileen Melton and [place]    Confirmed patient's address: Yes  Confirmed patient's phone number: Yes  Any changes to demographics: No   Confirmed patient's insurance: Yes  Any changes to patient's insurance: No   Discussed confidentiality: At previous visit  I connected with Eileen Melton  by a video enabled telemedicine application and verified that I am speaking with the correct person using two identifiers.     I discussed the limitations of evaluation and management by telemedicine and the availability of in person appointments.  I discussed that the purpose of this visit is to provide behavioral health care while limiting exposure to the novel coronavirus.   Discussed there is a possibility of technology failure and discussed alternative modes of communication if that failure occurs.  I discussed that engaging in this video visit, they consent to the provision of behavioral healthcare and the services will be billed under their insurance.  Patient and/or legal guardian expressed understanding and consented to video visit: Yes   PRESENTING CONCERNS: Patient and/or family reports the following symptoms/concerns: Pt states her primary symptoms are lack of quality sleep and difficulty relaxing (attributed to noisy roommates), and mild constipation;  family has been supportive with news of pregnancy and she is implementing self-coping strategies daily.  Duration of problem: Current pregnancy; Severity of problem: moderate  STRENGTHS (Protective Factors/Coping Skills): Supportive family;  practicing daily self-coping skills  GOALS ADDRESSED: Patient will: 1.  Reduce symptoms of: anxiety, depression and stress  2.  Increase knowledge and/or ability of: healthy habits and stress reduction  3.  Demonstrate ability to: Increase healthy adjustment to current life circumstances and Increase motivation to adhere to plan of care  INTERVENTIONS: Interventions utilized:  Solution-Focused Strategies and Psychoeducation and/or Health Education Standardized Assessments completed: GAD-7 and PHQ 9  ASSESSMENT: Patient currently experiencing Anxiety disorder, unspecified  Patient may benefit from continued psychoeducation and brief therapeutic interventions regarding coping with symptoms of anxiety, depression, and stress .  PLAN: 1. Follow up with behavioral health clinician on : As needed 2. Behavioral recommendations:  -Continue using daily self-coping strategies (worry time, relaxation breathing, etc) -Continue taking prenatal vitamin daily -Consider adding an extra glass of water and extra piece of fruit daily (apple, etc.), and 10-20 minute walk daily to help with mild constipation -Read Postpartum Planner in After Visit Summary -Consider signing up for childbirth education class via [email or web address]  -Continue with plan to move in with family/grandma soon 3. Referral(s): [facility] (In Clinic)  I discussed the assessment and treatment plan with the patient and/or parent/guardian. They were provided an opportunity to ask questions and all were answered. They agreed with the plan and demonstrated an understanding of the instructions.   They were advised to call back or seek an in-person evaluation if the symptoms worsen or if the condition fails to improve as anticipated.  Caroleen Hamman [facility]   Depression screen [facility] 2/9 01/26/2020 12/24/2019 12/17/2019 12/10/2019 10/21/2018  Decreased Interest 0 1 1 0 0  Down, Depressed, Hopeless 0 1 1 0 0  PHQ - 2  Score 0 2 2 0 0  Altered sleeping 3 3 3  3 0  Tired, decreased energy 1 3 2 1 1   Change in appetite 1 3 3 3 1   Feeling bad or failure about yourself  0 0 0 0 0  Trouble concentrating 0 1 1 0 0  Moving slowly or fidgety/restless 0 2 2 0 0  Suicidal thoughts 0 0 0 0 0  PHQ-9 Score 5 14 13 7 2    GAD 7 : Generalized Anxiety Score 01/26/2020 12/24/2019 12/17/2019 12/10/2019  Nervous, Anxious, on Edge 1 3 3 3   Control/stop worrying 1 3 3 2   Worry too much - different things 0 3 3 2   Trouble relaxing 2 3 3 3   Restless 0 3 3 0  Easily annoyed or irritable 1 3 3 3   Afraid - awful might happen 0 0 0 0  Total GAD 7 Score 5 18 18  13

## 2020-01-26 ENCOUNTER — Ambulatory Visit (INDEPENDENT_AMBULATORY_CARE_PROVIDER_SITE_OTHER): Payer: Medicaid Other | Admitting: Clinical

## 2020-01-26 DIAGNOSIS — F419 Anxiety disorder, unspecified: Secondary | ICD-10-CM

## 2020-01-26 NOTE — Patient Instructions (Signed)
BRAINSTORMING  Develop a Plan Goals: . Provide a way to start conversation about your new life with a baby . Assist parents in recognizing and using resources within their reach . Help pave the way before birth for an easier period of transition afterwards.  Make a list of the following information to keep in a central location: . Full name of Mom and Partner: _____________________________________________ . 34 full name and Date of Birth: ___________________________________________ . Home Address: ___________________________________________________________ ________________________________________________________________________ . Home Phone: ____________________________________________________________ . Parents' cell numbers: _____________________________________________________ ________________________________________________________________________ . Name and contact info for OB: ______________________________________________ . Name and contact info for Pediatrician:________________________________________ . Contact info for Lactation Consultants: ________________________________________  REST and SLEEP *You each need at least 4-5 hours of uninterrupted sleep every day. Write specific names and contact information.* . How are you going to rest in the postpartum period? While partner's home? When partner returns to work? When you both return to work? Marland Kitchen Where will your baby sleep? Marland Kitchen Who is available to help during the day? Evening? Night? . Who could move in for a period to help support you? Marland Kitchen What are some ideas to help you get enough sleep? __________________________________________________________________________________________________________________________________________________________________________________________________________________________________________ NUTRITIOUS FOOD AND DRINK *Plan for meals before your baby is born so you can have healthy food to eat  during the immediate postpartum period.* . Who will look after breakfast? Lunch? Dinner? List names and contact information. Brainstorm quick, healthy ideas for each meal. . What can you do before baby is born to prepare meals for the postpartum period? . How can others help you with meals? Marland Kitchen Which grocery stores provide online shopping and delivery? Marland Kitchen Which restaurants offer take-out or delivery options? ______________________________________________________________________________________________________________________________________________________________________________________________________________________________________________________________________________________________________________________________________________________________________________________________________  CARE FOR MOM *It's important that mom is cared for and pampered in the postpartum period. Remember, the most important ways new mothers need care are: sleep, nutrition, gentle exercise, and time off.* . Who can come take care of mom during this period? Make a list of people with their contact information. . List some activities that make you feel cared for, rested, and energized? Who can make sure you have opportunities to do these things? . Does mom have a space of her very own within your home that's just for her? Make a "Circles Of Care" where she can be comfortable, rest, and renew herself daily. ______________________________________________________________________________________________________________________________________________________________________________________________________________________________________________________________________________________________________________________________________________________________________________________________________    CARE FOR AND FEEDING BABY *Knowledgeable and encouraging people will offer the best support with regard to feeding your  baby.* . Educate yourself and choose the best feeding option for your baby. . Make a list of people who will guide, support, and be a resource for you as your care for and feed your baby. (Friends that have breastfed or are currently breastfeeding, lactation consultants, breastfeeding support groups, etc.) . Consider a postpartum doula. (These websites can give you information: dona.org & BuyingShow.es) . Seek out local breastfeeding resources like the breastfeeding support group at [employer] or [employer]. ______________________________________________________________________________________________________________________________________________________________________________________________________________________________________________________________________________________________________________________________________________________________________________________________________  Verner Chol AND ERRANDS . Who can help with a thorough cleaning before baby is born? . Make a list of people who will help with housekeeping and chores, like laundry, light cleaning, dishes, bathrooms, etc. . Who can run some errands for you? Marland Kitchen What can you do to make sure you are stocked with basic supplies before baby is born? . Who is going to do the shopping? ______________________________________________________________________________________________________________________________________________________________________________________________________________________________________________________________________________________________________________________________________________________________________________________________________     Family Adjustment *Nurture yourselves.it helps parents be more loving and allows for better bonding with their child.* .  What sorts of things do you and partner enjoy doing together? Which activities help you to connect and strengthen your relationship? Make a list of  those things. Make a list of people whom you trust to care for your baby so you can have some time together as a couple. . What types of things help partner feel connected to Mom? Make a list. . What needs will partner have in order to bond with baby? . Other children? Who will care for them when you go into labor and while you are in the hospital? . Think about what the needs of your older children might be. Who can help you meet those needs? In what ways are you helping them prepare for bringing baby home? List some specific strategies you have for family adjustment. _______________________________________________________________________________________________________________________________________________________________________________________________________________________________________________________________________________________________________________________________________________  SUPPORT *Someone who can empathize with experiences normalizes your problems and makes them more bearable.* . Make a list of other friends, neighbors, and/or co-workers you know with infants (and small children, if applicable) with whom you can connect. . Make a list of local or online support groups, mom groups, etc. in which you can be involved. ______________________________________________________________________________________________________________________________________________________________________________________________________________________________________________________________________________________________________________________________________________________________________________________________________  Childcare Plans . Investigate and plan for childcare if mom is returning to work. . Talk about mom's concerns about her transition back to work. . Talk about partner's concerns regarding this transition.  Mental Health *Your mental health is one of the highest priorities for  a pregnant or postpartum mom.* . 1 in 5 women experience anxiety and/or depression from the time of conception through the first year after birth. . Postpartum Mood Disorders are the #1 complication of pregnancy and childbirth and the suffering experienced by these mothers is not necessary! These illnesses are temporary and respond well to treatment, which often includes self-care, social support, talk therapy, and medication when needed. . Women experiencing anxiety and depression often say things like: "I'm supposed to be happy.why do I feel so sad?", "Why can't I snap out of it?", "I'm having thoughts that scare me." . There is no need to be embarrassed if you are feeling these symptoms: o Overwhelmed, anxious, angry, sad, guilty, irritable, hopeless, exhausted but can't sleep o You are NOT alone. You are NOT to blame. With help, you WILL be well. . Where can I find help? Medical professionals such as your OB, midwife, gynecologist, family practitioner, primary care provider, pediatrician, or mental health providers; [facility] support groups: Feelings After Birth, Breastfeeding Support Group, Baby and Me Group, and Fit 4 Two exercise classes. . You have permission to ask for help. It will confirm your feelings, validate your experiences, share/learn coping strategies, and gain support and encouragement as you heal. You are important! BRAINSTORM . Make a list of local resources, including resources for mom and for partner. . Identify support groups. . Identify people to call late at night - include names and contact info. . Talk with partner about perinatal mood and anxiety disorders. . Talk with your OB, midwife, and doula about baby blues and about perinatal mood and anxiety disorders. . Talk with your pediatrician about perinatal mood and anxiety disorders.   Support & Sanity Savers   What do you really need?  . Basics . In preparing for a new baby, many expectant parents spend  hours shopping for baby clothes, decorating the nursery, and deciding which car seat to buy. Yet most don't think much about what the reality of parenting a newborn will be like, and what they need to make  it through that. So, here is the advice of experienced parents. We know you'll read this, and think "they're exaggerating, I don't really need that." Just trust [place] on these, OK? Plan for all of this, and if it turns out you don't need it, come back and teach [place] how you did it!  Satira Anis (Once baby's survival needs are met, make sure you attend to your own survival needs!) . Sleep . An average newborn sleeps 16-18 hours per day, over 6-7 sleep periods, rarely more than three hours at a time. It is normal and healthy for a newborn to wake throughout the night... but really hard on parents!! . Naps. Prioritize sleep above any responsibilities like: cleaning house, visiting friends, running errands, etc.  Sleep whenever baby sleeps. If you can't nap, at least have restful times when baby eats. The more rest you get, the more patient you will be, the more emotionally stable, and better at solving problems.  . Food . You may not have realized it would be difficult to eat when you have a newborn. Yet, when we talk to . countless new parents, they say things like "it may be 2:00 pm when I realize I haven't had breakfast yet." Or "every time we sit down to dinner, baby needs to eat, and my food gets cold, so I don't bother to eat it." . Finger food. Before your baby is born, stock up with one months' worth of food that: 1) you can eat with one hand while holding a baby, 2) doesn't need to be prepped, 3) is good hot or cold, 4) doesn't spoil when left out for a few hours, and 5) you like to eat. Think about: nuts, dried fruit, Clif bars, pretzels, jerky, gogurt, baby carrots, apples, bananas, crackers, cheez-n-crackers, string cheese, hot pockets or frozen burritos to microwave, garden burgers and breakfast  pastries to put in the toaster, yogurt drinks, etc. . Restaurant Menus. Make lists of your favorite restaurants & menu items. When family/friends want to help, you can give specific information without much thought. They can either bring you the food or send gift cards for just the right meals. Rosaura Carpenter Meals.  Take some time to make a few meals to put in the freezer ahead of time.  Easy to freeze meals can be anything such as soup, lasagna, chicken pie, or spaghetti sauce. . Set up a Meal Schedule.  Ask friends and family to sign up to bring you meals during the first few weeks of being home. (It can be passed around at baby showers!) You have no idea how helpful this will be until you are in the throes of parenting.  https://hamilton-woodard.com/ is a great website to check out. . Emotional Support . Know who to call when you're stressed out. Parenting a newborn is very challenging work. There are times when it totally overwhelms your normal coping abilities. EVERY NEW PARENT NEEDS TO HAVE A PLAN FOR WHO TO CALL WHEN THEY JUST CAN'T COPE ANY MORE. (And it has to be someone other than the baby's other parent!) Before your baby is born, come up with at least one person you can call for support - write their phone number down and post it on the refrigerator. Marland Kitchen Anxiety & Sadness. Baby blues are normal after pregnancy; however, there are more severe types of anxiety & sadness which can occur and should not be ignored.  They are always treatable, but you have to take the first step by  reaching out for help. [facility] offers a "Mom Talk" group which meets every Tuesday from 10 am - 11 am.  This group is for new moms who need support and connection after their babies are born.  Call [phone].  Marland Kitchen Really, Really Helpful (Plan for them! Make sure these happen often!!) . Physical Support with Taking Care of Yourselves . Asking friends and family. Before your baby is born, set up a schedule of people who can come  and visit and help out (or ask a friend to schedule for you). Any time someone says "let me know what I can do to help," sign them up for a day. When they get there, their job is not to take care of the baby (that's your job and your joy). Their job is to take care of you!  . Postpartum doulas. If you don't have anyone you can call on for support, look into postpartum doulas:  professionals at helping parents with caring for baby, caring for themselves, getting breastfeeding started, and helping with household tasks. www.padanc.org is a helpful website for learning about doulas in our area. . Peer Support / Parent Groups . Why: One of the greatest ideas for new parents is to be around other new parents. Parent groups give you a chance to share and listen to others who are going through the same season of life, get a sense of what is normal infant development by watching several babies learn and grow, share your stories of triumph and struggles with empathetic ears, and forgive your own mistakes when you realize all parents are learning by trial and error. . Where to find: There are many places you can meet other new parents throughout our community.  [facility] offers the following classes for new moms and their little ones:  Baby and Me (Birth to [place]) and Breastfeeding Support Group. Go to www.conehealthybaby.com or call [phone] for more information. . Time for your Relationship . It's easy to get so caught up in meeting baby's immediate needs that it's hard to find time to connect with your partner, and meet the needs of your relationship. It's also easy to forget what "quality time with your partner" actually looks like. If you take your baby on a date, you'd be amazed how much of your couple time is spent feeding the baby, diapering the baby, admiring the baby, and talking about the baby. . Dating: Try to take time for just the two of you. Babysitter tip: Sometimes when moms are  breastfeeding a newborn, they find it hard to figure out how to schedule outings around baby's unpredictable feeding schedules. Have the babysitter come for a three hour period. When she comes over, if baby has just eaten, you can leave right away, and come back in two hours. If baby hasn't fed recently, you start the date at home. Once baby gets hungry and gets a good feeding in, you can head out for the rest of your date time. . Date Nights at Home: If you can't get out, at least set aside one evening a week to prioritize your relationship: whenever baby dozes off or doesn't have any immediate needs, spend a little time focusing on each other. . Potential conflicts: The main relationship conflicts that come up for new parents are: issues related to sexuality, financial stresses, a feeling of an unfair division of household tasks, and conflicts in parenting styles. The more you can work on these issues before baby arrives, the better!  Marland Kitchen  Fun and Frills (Don't forget these. and don't feel guilty for indulging in them!) . Everyone has something in life that is a fun little treat that they do just for themselves. It may be: reading the morning paper, or going for a daily jog, or having coffee with a friend once a week, or going to a movie on Friday nights, or fine chocolates, or bubble baths, or curling up with a good book. . Unless you do fun things for yourself every now and then, it's hard to have the energy for fun with your baby. Whatever your "special" treats are, make sure you find a way to continue to indulge in them after your baby is born. These special moments can recharge you, and allow you to return to baby with a new joy   PERINATAL MOOD DISORDERS: MATERNAL MENTAL HEALTH FROM CONCEPTION THROUGH THE POSTPARTUM PERIOD   Emergency and Crisis Resources:  If you are an imminent risk to self or others, are experiencing intense personal distress, and/or have noticed significant changes in activities  of daily living, call:  . 911 . [facility]: [phone] . Mobile Crisis: 877-626-1772 . National Suicide Hotline: 1-800-273-8255 Or visit the following crisis centers: . Local Emergency Departments . [facility]: [address],  [phone]. Hours: 8:30AM-5PM. Insurance Accepted: Medicaid, Medicare, and Uninsured.  . [facility]  [address], [place] Mon-Friday 8am-3pm  [phone]                                                                                    Non-Crisis Resources: To identify specific providers that are covered by your insurance, contact your insurance company or local agencies: [place]: 1-800-256-2452 [facility]: 888-581-9988 [employer]: 1-800-939-5911 Postpartum Support International- [place] 1-800-944-4773                                                      Outpatient therapy and medication management providers:  [facility] [phone] Hours: 9AM-5PM  Insurance Accepted: AARP, Aetna, BCBS, Cigna, [employer], Humana, Medicare  [facility] [phone] Hours: 8AM-5PM  nsurance Accepted: All insurances EXCEPT AARP, Aetna, [employer], and Humana [facility]: [phone]             Hours: 8AM-8PM Insurance Accepted: Aetna, BCBS, Cigna, [employer], Medicaid, Medicare, Uninsured [facility]: [phone] [facility]: [phone] Hours: 8:30AM-7PM Insurance Accepted: Aetna, BCBS, Medicaid, Medicare, Tricare, United Healthcare [facility]:  [phone]              Hours:9AM-5PM Insurance Accepted:  Aetna, BCBS, [facility], Medicaid, United Health Care  [facility] [phone] Hours: 9AM-5:30PM Insurance Accepted: AARP, Aetna, BCBS, Cigna, and Medicaid, Medicare, United Health Care [facility]:   [phone] Hours: 9am-5pm Insurance Accepted: BCBS; they do not accept Medicaid/Medicare [facility]: [phone] Hours: 9am-9pm Insurance Accepted: All major insurance including Medicaid and Medicare [facility]: [phone] Hours: 9AM-5:30PM Insurance   Accepted: All insurances EXCEPT Medicaid and Medicare. [facility]: [phone]                                                                       Parenting Support Groups [facility]: [phone] [facility]:  [phone] Family Support Network (support for children in the NICU and/or with special needs), [phone]                                                                   Mental Health Support Groups Mental Health Association: [phone]                                                                                     Online Resources: Postpartum Support International: http://www.postpartum.net/  800-944-4PPD 2Moms Supporting Moms:  www.momssupportingmoms.net     

## 2020-02-10 NOTE — Progress Notes (Signed)
I connected with Crista Elliot. Petras on 02/11/20 at  9:15 AM EST by: MyChart and verified that I am speaking with the correct person using two identifiers.  Patient is located at home and provider is located at [facility].     The purpose of this virtual visit is to provide medical care while limiting exposure to the novel coronavirus. I discussed the limitations, risks, security and privacy concerns of performing an evaluation and management service by MyChart and the availability of in person appointments. I also discussed with the patient that there may be a patient responsible charge related to this service. By engaging in this virtual visit, you consent to the provision of healthcare.  Additionally, you authorize for your insurance to be billed for the services provided during this visit.  The patient expressed understanding and agreed to proceed.  The following staff members participated in the virtual visit:  Vernice Jefferson    PRENATAL VISIT NOTE  Subjective:  TARI LECOUNT is a 24 y.o. G2P0010 at [redacted]w[redacted]d for phone visit for ongoing prenatal care.  She is currently monitored for the following issues for this low-risk pregnancy and has Marijuana use; Supervision of low-risk pregnancy; and Anxiety on their problem list.  Patient reports no complaints.  Contractions: Not present. Vag. Bleeding: None.  Movement: Present. Denies leaking of fluid.   The following portions of the patient's history were reviewed and updated as appropriate: allergies, current medications, past family history, past medical history, past social history, past surgical history and problem list.   Objective:   Vitals:   02/11/20 0917  BP: 110/79  Self-Obtained  Fetal Status:     Movement: Present     Assessment and Plan:  Pregnancy: G2P0010 at 222w2d1. Encounter for supervision of low-risk pregnancy in second trimester -discussed contraception, pt unsure, information given -f/u anatomy scan scheduled 02/17/2020, pt  aware -pt not aware of structure of practice, explained to patient, pt may consider transferring to know delivering provider  2. [facility] pt reports no additional visit scheduled, pt encouraged to make additional appointment and to schedule on regular basis  3. Marijuana use -discouraged use in pregnancy, info given, pt reports she stopped using marijuana when she found out she was pregnant  Preterm labor symptoms and general obstetric precautions including but not limited to vaginal bleeding, contractions, leaking of fluid and fetal movement were reviewed in detail with the patient. I discussed the assessment and treatment plan with the patient. The patient was provided an opportunity to ask questions and all were answered. The patient agreed with the plan and demonstrated an understanding of the instructions. The patient was advised to call back or seek an in-person office evaluation/[facility].  Return in about 4 weeks (around 03/10/2020) for in-person LOB.  Future Appointments  Date Time Provider DeNiar[place]3/01/2020 11:30 AM WH-MFC [place] WH-MFCUS MFC-US     Time spent on virtual visit: 12 minutes  NiClarisa FlingNP

## 2020-02-11 ENCOUNTER — Telehealth (INDEPENDENT_AMBULATORY_CARE_PROVIDER_SITE_OTHER): Payer: Medicaid Other | Admitting: Women's Health

## 2020-02-11 VITALS — BP 110/79

## 2020-02-11 DIAGNOSIS — O99343 Other mental disorders complicating pregnancy, third trimester: Secondary | ICD-10-CM

## 2020-02-11 DIAGNOSIS — F1290 Cannabis use, unspecified, uncomplicated: Secondary | ICD-10-CM | POA: Diagnosis not present

## 2020-02-11 DIAGNOSIS — O99322 Drug use complicating pregnancy, second trimester: Secondary | ICD-10-CM | POA: Diagnosis not present

## 2020-02-11 DIAGNOSIS — F419 Anxiety disorder, unspecified: Secondary | ICD-10-CM | POA: Diagnosis not present

## 2020-02-11 DIAGNOSIS — Z3492 Encounter for supervision of normal pregnancy, unspecified, second trimester: Secondary | ICD-10-CM

## 2020-02-11 DIAGNOSIS — Z3A22 22 weeks gestation of pregnancy: Secondary | ICD-10-CM

## 2020-02-11 NOTE — Progress Notes (Signed)
I connected with  Eileen Melton on 02/11/20 at  9:15 AM EST by telephone and verified that I am speaking with the correct person using two identifiers.   I discussed the limitations, risks, security and privacy concerns of performing an evaluation and management service by telephone and the availability of in person appointments. I also discussed with the patient that there may be a patient responsible charge related to this service. The patient expressed understanding and agreed to proceed.  Janene Madeira Mckinsey Keagle, CMA 02/11/2020  9:16 AM

## 2020-02-11 NOTE — Patient Instructions (Addendum)
Contraception Choices - www.bedsider.org Contraception, also called birth control, refers to methods or devices that prevent pregnancy. Hormonal methods Contraceptive implant  A contraceptive implant is a thin, plastic tube that contains a hormone. It is inserted into the upper part of the arm. It can remain in place for up to 3 years. Progestin-only injections Progestin-only injections are injections of progestin, a synthetic form of the hormone progesterone. They are given every 3 months by a health care provider. Birth control pills  Birth control pills are pills that contain hormones that prevent pregnancy. They must be taken once a day, preferably at the same time each day. Birth control patch  The birth control patch contains hormones that prevent pregnancy. It is placed on the skin and must be changed once a week for three weeks and removed on the fourth week. A prescription is needed to use this method of contraception. Vaginal ring  A vaginal ring contains hormones that prevent pregnancy. It is placed in the vagina for three weeks and removed on the fourth week. After that, the process is repeated with a new ring. A prescription is needed to use this method of contraception. Emergency contraceptive Emergency contraceptives prevent pregnancy after unprotected sex. They come in pill form and can be taken up to 5 days after sex. They work best the sooner they are taken after having sex. Most emergency contraceptives are available without a prescription. This method should not be used as your only form of birth control. Barrier methods Female condom  A female condom is a thin sheath that is worn over the penis during sex. Condoms keep sperm from going inside a woman's body. They can be used with a spermicide to increase their effectiveness. They should be disposed after a single use. Female condom  A female condom is a soft, loose-fitting sheath that is put into the vagina before sex. The  condom keeps sperm from going inside a woman's body. They should be disposed after a single use. Diaphragm  A diaphragm is a soft, dome-shaped barrier. It is inserted into the vagina before sex, along with a spermicide. The diaphragm blocks sperm from entering the uterus, and the spermicide kills sperm. A diaphragm should be left in the vagina for 6-8 hours after sex and removed within 24 hours. A diaphragm is prescribed and fitted by a health care provider. A diaphragm should be replaced every 1-2 years, after giving birth, after gaining more than 15 lb (6.8 kg), and after pelvic surgery. Cervical cap  A cervical cap is a round, soft latex or plastic cup that fits over the cervix. It is inserted into the vagina before sex, along with spermicide. It blocks sperm from entering the uterus. The cap should be left in place for 6-8 hours after sex and removed within 48 hours. A cervical cap must be prescribed and fitted by a health care provider. It should be replaced every 2 years. Sponge  A sponge is a soft, circular piece of polyurethane foam with spermicide on it. The sponge helps block sperm from entering the uterus, and the spermicide kills sperm. To use it, you make it wet and then insert it into the vagina. It should be inserted before sex, left in for at least 6 hours after sex, and removed and thrown away within 30 hours. Spermicides Spermicides are chemicals that kill or block sperm from entering the cervix and uterus. They can come as a cream, jelly, suppository, foam, or tablet. A spermicide should be inserted  into the vagina with an applicator at least 10-15 minutes before sex to allow time for it to work. The process must be repeated every time you have sex. Spermicides do not require a prescription. Intrauterine contraception Intrauterine device (IUD) An IUD is a T-shaped device that is put in a woman's uterus. There are two types:  Hormone IUD.This type contains progestin, a synthetic  form of the hormone progesterone. This type can stay in place for 3-5 years.  Copper IUD.This type is wrapped in copper wire. It can stay in place for 10 years.  Permanent methods of contraception Female tubal ligation In this method, a woman's fallopian tubes are sealed, tied, or blocked during surgery to prevent eggs from traveling to the uterus. Hysteroscopic sterilization In this method, a small, flexible insert is placed into each fallopian tube. The inserts cause scar tissue to form in the fallopian tubes and block them, so sperm cannot reach an egg. The procedure takes about 3 months to be effective. Another form of birth control must be used during those 3 months. Female sterilization This is a procedure to tie off the tubes that carry sperm (vasectomy). After the procedure, the man can still ejaculate fluid (semen). Natural planning methods Natural family planning In this method, a couple does not have sex on days when the woman could become pregnant. Calendar method This means keeping track of the length of each menstrual cycle, identifying the days when pregnancy can happen, and not having sex on those days. Ovulation method In this method, a couple avoids sex during ovulation. Symptothermal method This method involves not having sex during ovulation. The woman typically checks for ovulation by watching changes in her temperature and in the consistency of cervical mucus. Post-ovulation method In this method, a couple waits to have sex until after ovulation. Summary  Contraception, also called birth control, means methods or devices that prevent pregnancy.  Hormonal methods of contraception include implants, injections, pills, patches, vaginal rings, and emergency contraceptives.  Barrier methods of contraception can include female condoms, female condoms, diaphragms, cervical caps, sponges, and spermicides.  There are two types of IUDs (intrauterine devices). An IUD can be put in  a woman's uterus to prevent pregnancy for 3-5 years.  Permanent sterilization can be done through a procedure for males, females, or both.  Natural family planning methods involve not having sex on days when the woman could become pregnant. This information is not intended to replace advice given to you by your health care provider. Make sure you discuss any questions you have with your health care provider. Document Revised: 12/06/2017 Document Reviewed: 01/06/2017 Elsevier Patient Education  2020 [employer].  Maternity Assessment Unit (MAU)  The Maternity Assessment Unit (MAU) is located at the [facility] at [facility]. The address is: [address] [place], [place], [place] [address]. Please see map below for additional directions.    The Maternity Assessment Unit is designed to help you during your pregnancy, and for up to 6 weeks after delivery, with any pregnancy- or postpartum-related emergencies, if you think you are in labor, or if your water has broken. For example, if you experience nausea and vomiting, vaginal bleeding, severe abdominal or pelvic pain, elevated blood pressure or other problems related to your pregnancy or postpartum time, please come to the Maternity Assessment Unit for assistance.  Preterm Labor and Birth Information  The normal length of a pregnancy is 39-41 weeks. Preterm labor is when labor starts before  37 completed weeks of pregnancy. What are the risk factors for preterm labor? Preterm labor is more likely to occur in women who:  Have certain infections during pregnancy such as a bladder infection, sexually transmitted infection, or infection inside the uterus (chorioamnionitis).  Have a shorter-than-normal cervix.  Have gone into preterm labor before.  Have had surgery on their cervix.  Are younger than age 34 or older than age 31.  Are African American.  Are pregnant with twins or multiple babies  (multiple gestation).  Take street drugs or smoke while pregnant.  Do not gain enough weight while pregnant.  Became pregnant shortly after having been pregnant. What are the symptoms of preterm labor? Symptoms of preterm labor include:  Cramps similar to those that can happen during a menstrual period. The cramps may happen with diarrhea.  Pain in the abdomen or lower back.  Regular uterine contractions that may feel like tightening of the abdomen.  A feeling of increased pressure in the pelvis.  Increased watery or bloody mucus discharge from the vagina.  Water breaking (ruptured amniotic sac). Why is it important to recognize signs of preterm labor? It is important to recognize signs of preterm labor because babies who are born prematurely may not be fully developed. This can put them at an increased risk for:  Long-term (chronic) heart and lung problems.  Difficulty immediately after birth with regulating body systems, including blood sugar, body temperature, heart rate, and breathing rate.  Bleeding in the brain.  Cerebral palsy.  Learning difficulties.  Death. These risks are highest for babies who are born before 37 weeks of pregnancy. How is preterm labor treated? Treatment depends on the length of your pregnancy, your condition, and the health of your baby. It may involve:  Having a stitch (suture) placed in your cervix to prevent your cervix from opening too early (cerclage).  Taking or being given medicines, such as: ? Hormone medicines. These may be given early in pregnancy to help support the pregnancy. ? Medicine to stop contractions. ? Medicines to help mature the baby's lungs. These may be prescribed if the risk of delivery is high. ? Medicines to prevent your baby from developing cerebral palsy. If the labor happens before 34 weeks of pregnancy, you may need to stay in the hospital. What should I do if I think I am in preterm labor? If you think that  you are going into preterm labor, call your health care provider right away. How can I prevent preterm labor in future pregnancies? To increase your chance of having a full-term pregnancy:  Do not use any tobacco products, such as cigarettes, chewing tobacco, and e-cigarettes. If you need help quitting, ask your health care provider.  Do not use street drugs or medicines that have not been prescribed to you during your pregnancy.  Talk with your health care provider before taking any herbal supplements, even if you have been taking them regularly.  Make sure you gain a healthy amount of weight during your pregnancy.  Watch for infection. If you think that you might have an infection, get it checked right away.  Make sure to tell your health care provider if you have gone into preterm labor before. This information is not intended to replace advice given to you by your health care provider. Make sure you discuss any questions you have with your health care provider. Document Revised: 03/28/2019 Document Reviewed: 04/26/2016 Elsevier Patient Education  [place].   Marijuana Use During  Pregnancy and Breastfeeding  Marijuana is the dried leaves, flowers, and stems of the Cannabis sativa or Cannabis indica plant. The plant's active ingredients (cannabinoids), including a chemical called THC, change the chemistry of the brain. Marijuana smoke also has many of the same chemicals as cigarette smoke that cause breathing problems. Marijuana gets into your blood through your lungs when you smoke it and through your digestive system when you swallow it. Using marijuana in any form may be harmful for you and your baby when you are trying to become pregnant and during pregnancy. This includes marijuana that is prescribed to you by a health care provider (medical marijuana). Once marijuana is in your blood, it can travel through your placenta to your baby. It may also pass through breast milk. How  does this affect me? Marijuana affects you both mentally and physically. Using marijuana can make you feel high and relaxed. It can also have negative effects, especially at high doses or with long-term use. These include:  Rapid heartbeat and stress on your heart.  Lung irritation and breathing problems.  Difficulty thinking and making decisions.  Seeing or believing things that are not true (hallucinations and paranoia).  Mood swings, depression, or anxiety.  Decreased ability to learn and remember.  Difficulty getting pregnant. Marijuana can also affect your pregnancy. Not all the effects are known. However, if you use marijuana during pregnancy, you may:  Be less likely to get regular prenatal care and do the things that you need to do to have a healthy pregnancy.  Be more likely to use other drugs that can harm your pregnancy, like drinking alcohol and smoking cigarettes.  Be at higher risk of having your baby die after 28 weeks of pregnancy (stillbirth).  Be at higher risk of giving birth before 37 weeks of pregnancy (premature birth). How does this affect my baby? If you use marijuana during pregnancy, this may affect your baby's development, birth, and life after birth. Your baby may:  Be born prematurely, which can cause physical and mental problems.  Be born with a low birth weight, which can lead to physical and mental problems.  Have problems with brain development.  Have difficulty growing.  Have attention and behavior problems later in life.  Do poorly at school and have learning problems later in life.  Have problems with vision and coordination.  Be at higher risk for using marijuana by age 83. More research is needed to find out exactly how marijuana affects a baby during breastfeeding. Some studies suggest that the chemicals in marijuana can be passed to a baby through breast milk. To limit possible risks, you should not use marijuana during  breastfeeding. Follow these instructions at home:  Let your health care provider know if you use marijuana before trying to get pregnant, during pregnancy, or during breastfeeding.  Do not use marijuana in any form when you are trying to get pregnant, when you are pregnant, or when you are breastfeeding. If you are having trouble stopping marijuana use, ask your health care provider for help.  Do not smoke. If you need help quitting, ask your health care provider for help.  If you are using medical marijuana, ask your health care provider to switch you to a medicine that is safer to use during pregnancy or breastfeeding.  Keep all your prenatal visits as told by your health care provider. This is important. Where to find more information [employer] on Drug Abuse: www.drugabuse.gov March of Dimes: www.marchofdimes.org/pregnancy Contact  a health care provider if:  You use marijuana and want to get pregnant.  You use marijuana during pregnancy or breastfeeding.  You need help stopping marijuana use. Get help right away if:  Your baby is not gaining weight or growing as expected. Summary  Using marijuana in any form may be harmful for you and your baby when you are trying to become pregnant, during pregnancy, and during breastfeeding. This includes marijuana that is prescribed to you (medical marijuana).  Some studies suggest that marijuana may pass through breast milk and can affect your baby's brain development.  Talk to your health care provider if you use marijuana in any form while trying to get pregnant, during pregnancy, or while breastfeeding.  Ask your health care provider for help if you are not able to stop using marijuana. This information is not intended to replace advice given to you by your health care provider. Make sure you discuss any questions you have with your health care provider. Document Revised: 03/28/2019 Document Reviewed: 08/22/2017 Elsevier Patient  Education  2020 [employer].

## 2020-02-17 ENCOUNTER — Ambulatory Visit (HOSPITAL_COMMUNITY)
Admission: RE | Admit: 2020-02-17 | Discharge: 2020-02-17 | Disposition: A | Discharge status: Home / Self Care | Payer: Medicaid Other | Source: Ambulatory Visit | Attending: Obstetrics and Gynecology | Admitting: Obstetrics and Gynecology

## 2020-02-17 ENCOUNTER — Other Ambulatory Visit: Payer: Self-pay

## 2020-02-17 DIAGNOSIS — Z362 Encounter for other antenatal screening follow-up: Secondary | ICD-10-CM | POA: Insufficient documentation

## 2020-02-17 DIAGNOSIS — Z3A22 22 weeks gestation of pregnancy: Secondary | ICD-10-CM | POA: Diagnosis not present

## 2020-02-17 DIAGNOSIS — O289 Unspecified abnormal findings on antenatal screening of mother: Secondary | ICD-10-CM | POA: Diagnosis not present

## 2020-03-09 DIAGNOSIS — O283 Abnormal ultrasonic finding on antenatal screening of mother: Secondary | ICD-10-CM | POA: Insufficient documentation

## 2020-03-09 HISTORY — DX: Abnormal ultrasonic finding on antenatal screening of mother: O28.3

## 2020-03-10 ENCOUNTER — Telehealth: Payer: Self-pay

## 2020-03-10 NOTE — Telephone Encounter (Signed)
Attempted to contact patient to get her rescheduled for her missed OB appointment. No answer, left voicemail for patient to give the office a call back to be rescheduled. No show letter mailed and mychart message sent

## 2020-03-15 ENCOUNTER — Telehealth: Payer: Self-pay | Admitting: Family Medicine

## 2020-03-15 ENCOUNTER — Telehealth (INDEPENDENT_AMBULATORY_CARE_PROVIDER_SITE_OTHER): Payer: Medicaid Other | Admitting: Family Medicine

## 2020-03-15 DIAGNOSIS — Z7689 Persons encountering health services in other specified circumstances: Secondary | ICD-10-CM

## 2020-03-15 NOTE — Telephone Encounter (Signed)
The patient called in to reschedule her missed appointment. She also stated that she became really dizzy last weekend and stated seeing bright lights. She also stated she did not have her blood pressure cuff with her to take her blood pressure and she is unsure of what was happening. 

## 2020-03-15 NOTE — Telephone Encounter (Signed)
The patient called in to reschedule her missed appointment. She also stated that she became really dizzy last weekend and stated seeing bright lights. She also stated she did not have her blood pressure cuff with her to take her blood pressure and she is unsure of what was happening.

## 2020-03-15 NOTE — Telephone Encounter (Signed)
Called patient stating I am reaching out to her as someone from our front office told me she wasn't feeling well over the weekend. Patient states two weeks ago she was standing outside and suddenly became dizzy and had blurry vision. Patient reports good fetal movement and feeling fine since then. Discussed with patient dizzy spells can happen with pregnancy especially with prolonged standing, position changes, heat, or not being fully hydrated. Asked if she had checked her BP and she states no. Asked her to check it when she can and advised she check her BP once a week. Patient verbalized understanding & also notes top of her abdomen being sore similar to if she did a lot of crunches. Discussed it is likely due to stretching/growing of pregnancy or from baby's movements. Patient verbalized understanding & will call back if she has other concerns.

## 2020-03-23 ENCOUNTER — Other Ambulatory Visit: Payer: Self-pay

## 2020-03-23 ENCOUNTER — Ambulatory Visit (INDEPENDENT_AMBULATORY_CARE_PROVIDER_SITE_OTHER): Payer: Medicaid Other

## 2020-03-23 VITALS — BP 117/70 | HR 102 | Wt 161.7 lb

## 2020-03-23 DIAGNOSIS — Z3493 Encounter for supervision of normal pregnancy, unspecified, third trimester: Secondary | ICD-10-CM | POA: Diagnosis present

## 2020-03-23 DIAGNOSIS — Z3A28 28 weeks gestation of pregnancy: Secondary | ICD-10-CM

## 2020-03-23 DIAGNOSIS — Z23 Encounter for immunization: Secondary | ICD-10-CM

## 2020-03-23 DIAGNOSIS — O99343 Other mental disorders complicating pregnancy, third trimester: Secondary | ICD-10-CM

## 2020-03-23 DIAGNOSIS — F419 Anxiety disorder, unspecified: Secondary | ICD-10-CM

## 2020-03-23 DIAGNOSIS — Z3009 Encounter for other general counseling and advice on contraception: Secondary | ICD-10-CM

## 2020-03-23 NOTE — Progress Notes (Signed)
PRENATAL VISIT NOTE  Subjective:  Eileen Melton is a 24 y.o. G2P0010 at [redacted]w[redacted]d who presents today for routine prenatal care.  She is currently being monitored for supervision of a low-risk pregnancy with problems as listed below.  Patient has no pregnancy related concerns, but reports a "20%" difference between her nipple sizes.  Patient endorses fetal movement.  She reports some intermittent contractions, but denies abdominal cramping.  Patient also denies vaginal concerns including discharge, bleeding, leaking, itching, and burning.   Patient Active Problem List   Diagnosis Date Noted  . Echogenic intracardiac focus of fetus on prenatal ultrasound 03/09/2020  . Supervision of low-risk pregnancy 12/10/2019  . Anxiety 12/10/2019  . Marijuana use 10/21/2018    The following portions of the patient's history were reviewed and updated as appropriate: allergies, current medications, past family history, past medical history, past social history, past surgical history and problem list. Problem list updated.  Objective:   Vitals:   03/23/20 1023  BP: 117/70  Pulse: (!) 102  Weight: 161 lb 11.2 oz (73.3 kg)    Fetal Status: Fetal Heart Rate (bpm): 143 Fundal Height: 28 cm Movement: Present     General:  Alert, oriented and cooperative. Patient is in no acute distress.  Skin: Skin is warm and dry.   Cardiovascular: Regular rate and rhythm.  Respiratory: Normal respiratory effort. CTA-Bilaterally  Abdomen: Soft, gravid, appropriate for gestational age.  Pelvic: Cervical exam deferred        Extremities: Normal range of motion.  Edema: None  Mental Status: Normal mood and affect. Normal behavior. Normal judgment and thought content.   Assessment and Plan:  Pregnancy: G2P0010 at [redacted]w[redacted]d  1. Encounter for supervision of low-risk pregnancy in third trimester -Reassured that normal to have some variations in symmetry of breast.  Informed that size difference was likely present in the past,  but only noticeable with growth s/t pregnancy. -Warning signs given for breast changes of concern including discharge with foul odor or color, breast pain, lumps, or skin changes. -Reviewed Glucola appt preparation including fasting the night before and morning of.   -Discussed anticipated office time of 2.5-3 hours.  -Reviewed blood draw procedures and labs which also include check of iron level.  -Discussed how results of GTT are handled including diabetic education and BS testing for abnormal results and routine care for normal results.   2. Anxiety -Patient reports anxiety is reduced. -Reviewed how she is at increased risk for PP Depression and/or anxiety due to history. -Patient has been in contact with BHI specialist..  3. Birth control counseling -Reviewed desires and past usage of birth control. -Patient reports desire, but states she has used pills in the past with negative results (depression, mood swings). -Patient states she thinks these symptoms were related to "normal" teen mood swings.  -Informed that depression and anxiety are not mood swings, but mood disorders that can be intensified with hormonal changes s/t contraception, but not the cause of the diagnosis.  -Patient given information, via AVS, regarding contraception choices. -Encouraged to use bedsider.org for further information on methods.    Preterm labor symptoms and general obstetric precautions including but not limited to vaginal bleeding, contractions, leaking of fluid and fetal movement were reviewed with the patient.  Please refer to After Visit Summary for other counseling recommendations.  Return in about 3 weeks (around 04/13/2020) for LR-ROB in person per patient preference.  Future Appointments  Date Time Provider Department Center  03/26/2020  8:50 AM  [facility]  04/14/2020 11:15 AM [place], CNM [facility] [place] Eileen Melton, CNM 03/23/2020, 11:02 AM

## 2020-03-23 NOTE — Patient Instructions (Addendum)
Glucose Tolerance Test During Pregnancy Why am I having this test? The glucose tolerance test (GTT) is done to check how your body processes sugar (glucose). This is one of several tests used to diagnose diabetes that develops during pregnancy (gestational diabetes mellitus). Gestational diabetes is a temporary form of diabetes that some women develop during pregnancy. It usually occurs during the second trimester of pregnancy and goes away after delivery. Testing (screening) for gestational diabetes usually occurs between 24 and 28 weeks of pregnancy. You may have the GTT test after having a 1-hour glucose screening test if the results from that test indicate that you may have gestational diabetes. You may also have this test if:  You have a history of gestational diabetes.  You have a history of giving birth to very large babies or have experienced repeated fetal loss (stillbirth).  You have signs and symptoms of diabetes, such as: ? Changes in your vision. ? Tingling or numbness in your hands or feet. ? Changes in hunger, thirst, and urination that are not otherwise explained by your pregnancy. What is being tested? This test measures the amount of glucose in your blood at different times during a period of 3 hours. This indicates how well your body is able to process glucose. What kind of sample is taken?  Blood samples are required for this test. They are usually collected by inserting a needle into a blood vessel. How do I prepare for this test?  For 3 days before your test, eat normally. Have plenty of carbohydrate-rich foods.  Follow instructions from your health care provider about: ? Eating or drinking restrictions on the day of the test. You may be asked to not eat or drink anything other than water (fast) starting 8-10 hours before the test. ? Changing or stopping your regular medicines. Some medicines may interfere with this test. Tell a health care provider about:  All  medicines you are taking, including vitamins, herbs, eye drops, creams, and over-the-counter medicines.  Any blood disorders you have.  Any surgeries you have had.  Any medical conditions you have. What happens during the test? First, your blood glucose will be measured. This is referred to as your fasting blood glucose, since you fasted before the test. Then, you will drink a glucose solution that contains a certain amount of glucose. Your blood glucose will be measured again 1, 2, and 3 hours after drinking the solution. This test takes about 3 hours to complete. You will need to stay at the testing location during this time. During the testing period:  Do not eat or drink anything other than the glucose solution.  Do not exercise.  Do not use any products that contain nicotine or tobacco, such as cigarettes and e-cigarettes. If you need help stopping, ask your health care provider. The testing procedure may vary among health care providers and hospitals. How are the results reported? Your results will be reported as milligrams of glucose per deciliter of blood (mg/dL) or millimoles per liter (mmol/L). Your health care provider will compare your results to normal ranges that were established after testing a large group of people (reference ranges). Reference ranges may vary among labs and hospitals. For this test, common reference ranges are:  Fasting: less than 95-105 mg/dL (5.3-5.8 mmol/L).  1 hour after drinking glucose: less than 180-190 mg/dL (10.0-10.5 mmol/L).  2 hours after drinking glucose: less than 155-165 mg/dL (8.6-9.2 mmol/L).  3 hours after drinking glucose: 140-145 mg/dL (7.8-8.1 mmol/L). What do the   results mean? Results within reference ranges are considered normal, meaning that your glucose levels are well-controlled. If two or more of your blood glucose levels are high, you may be diagnosed with gestational diabetes. If only one level is high, your health care  provider may suggest repeat testing or other tests to confirm a diagnosis. Talk with your health care provider about what your results mean. Questions to ask your health care provider Ask your health care provider, or the department that is doing the test:  When will my results be ready?  How will I get my results?  What are my treatment options?  What other tests do I need?  What are my next steps? Summary  The glucose tolerance test (GTT) is one of several tests used to diagnose diabetes that develops during pregnancy (gestational diabetes mellitus). Gestational diabetes is a temporary form of diabetes that some women develop during pregnancy.  You may have the GTT test after having a 1-hour glucose screening test if the results from that test indicate that you may have gestational diabetes. You may also have this test if you have any symptoms or risk factors for gestational diabetes.  Talk with your health care provider about what your results mean. This information is not intended to replace advice given to you by your health care provider. Make sure you discuss any questions you have with your health care provider. Document Revised: 03/27/2019 Document Reviewed: 07/16/2017 Elsevier Patient Education  2020 [employer].  [place] Contraception Choices Contraception, also called birth control, refers to methods or devices that prevent pregnancy. Hormonal methods Contraceptive implant  A contraceptive implant is a thin, plastic tube that contains a hormone. It is inserted into the upper part of the arm. It can remain in place for up to 3 years. Progestin-only injections Progestin-only injections are injections of progestin, a synthetic form of the hormone progesterone. They are given every 3 months by a health care provider. Birth control pills  Birth control pills are pills that contain hormones that prevent pregnancy. They must be taken once a day, preferably at the same  time each day. Birth control patch  The birth control patch contains hormones that prevent pregnancy. It is placed on the skin and must be changed once a week for three weeks and removed on the fourth week. A prescription is needed to use this method of contraception. Vaginal ring  A vaginal ring contains hormones that prevent pregnancy. It is placed in the vagina for three weeks and removed on the fourth week. After that, the process is repeated with a new ring. A prescription is needed to use this method of contraception. Emergency contraceptive Emergency contraceptives prevent pregnancy after unprotected sex. They come in pill form and can be taken up to 5 days after sex. They work best the sooner they are taken after having sex. Most emergency contraceptives are available without a prescription. This method should not be used as your only form of birth control. Barrier methods Female condom  A female condom is a thin sheath that is worn over the penis during sex. Condoms keep sperm from going inside a woman's body. They can be used with a spermicide to increase their effectiveness. They should be disposed after a single use. Female condom  A female condom is a soft, loose-fitting sheath that is put into the vagina before sex. The condom keeps sperm from going inside a woman's body. They should be disposed after a single use. Diaphragm  A  diaphragm is a soft, dome-shaped barrier. It is inserted into the vagina before sex, along with a spermicide. The diaphragm blocks sperm from entering the uterus, and the spermicide kills sperm. A diaphragm should be left in the vagina for 6-8 hours after sex and removed within 24 hours. A diaphragm is prescribed and fitted by a health care provider. A diaphragm should be replaced every 1-2 years, after giving birth, after gaining more than 15 lb (6.8 kg), and after pelvic surgery. Cervical cap  A cervical cap is a round, soft latex or plastic cup that fits  over the cervix. It is inserted into the vagina before sex, along with spermicide. It blocks sperm from entering the uterus. The cap should be left in place for 6-8 hours after sex and removed within 48 hours. A cervical cap must be prescribed and fitted by a health care provider. It should be replaced every 2 years. Sponge  A sponge is a soft, circular piece of polyurethane foam with spermicide on it. The sponge helps block sperm from entering the uterus, and the spermicide kills sperm. To use it, you make it wet and then insert it into the vagina. It should be inserted before sex, left in for at least 6 hours after sex, and removed and thrown away within 30 hours. Spermicides Spermicides are chemicals that kill or block sperm from entering the cervix and uterus. They can come as a cream, jelly, suppository, foam, or tablet. A spermicide should be inserted into the vagina with an applicator at least 10-15 minutes before sex to allow time for it to work. The process must be repeated every time you have sex. Spermicides do not require a prescription. Intrauterine contraception Intrauterine device (IUD) An IUD is a T-shaped device that is put in a woman's uterus. There are two types:  Hormone IUD.This type contains progestin, a synthetic form of the hormone progesterone. This type can stay in place for 3-5 years.  Copper IUD.This type is wrapped in copper wire. It can stay in place for 10 years.  Permanent methods of contraception Female tubal ligation In this method, a woman's fallopian tubes are sealed, tied, or blocked during surgery to prevent eggs from traveling to the uterus. Hysteroscopic sterilization In this method, a small, flexible insert is placed into each fallopian tube. The inserts cause scar tissue to form in the fallopian tubes and block them, so sperm cannot reach an egg. The procedure takes about 3 months to be effective. Another form of birth control must be used during those 3  months. Female sterilization This is a procedure to tie off the tubes that carry sperm (vasectomy). After the procedure, the man can still ejaculate fluid (semen). Natural planning methods Natural family planning In this method, a couple does not have sex on days when the woman could become pregnant. Calendar method This means keeping track of the length of each menstrual cycle, identifying the days when pregnancy can happen, and not having sex on those days. Ovulation method In this method, a couple avoids sex during ovulation. Symptothermal method This method involves not having sex during ovulation. The woman typically checks for ovulation by watching changes in her temperature and in the consistency of cervical mucus. Post-ovulation method In this method, a couple waits to have sex until after ovulation. Summary  Contraception, also called birth control, means methods or devices that prevent pregnancy.  Hormonal methods of contraception include implants, injections, pills, patches, vaginal rings, and emergency contraceptives.  Barrier methods  of contraception can include female condoms, female condoms, diaphragms, cervical caps, sponges, and spermicides.  There are two types of IUDs (intrauterine devices). An IUD can be put in a woman's uterus to prevent pregnancy for 3-5 years.  Permanent sterilization can be done through a procedure for males, females, or both.  Natural family planning methods involve not having sex on days when the woman could become pregnant. This information is not intended to replace advice given to you by your health care provider. Make sure you discuss any questions you have with your health care provider. Document Revised: 12/06/2017 Document Reviewed: 01/06/2017 Elsevier Patient Education  2020 [employer].

## 2020-03-25 ENCOUNTER — Encounter: Payer: Self-pay | Admitting: *Deleted

## 2020-03-26 ENCOUNTER — Other Ambulatory Visit: Payer: Self-pay

## 2020-03-26 ENCOUNTER — Other Ambulatory Visit: Payer: Medicaid Other

## 2020-03-26 DIAGNOSIS — Z3493 Encounter for supervision of normal pregnancy, unspecified, third trimester: Secondary | ICD-10-CM

## 2020-03-26 NOTE — Patient Instructions (Signed)
If you are in need of transportation to get to and from your appointments in our office.  You can reach Transportation Services by calling [phone] Monday - Friday  7am-6pm.   

## 2020-03-27 LAB — CBC
Hematocrit: 31.2 % — ABNORMAL LOW (ref 34.0–46.6)
Hemoglobin: 9.8 g/dL — ABNORMAL LOW (ref 11.1–15.9)
MCH: 23.3 pg — ABNORMAL LOW (ref 26.6–33.0)
MCHC: 31.4 g/dL — ABNORMAL LOW (ref 31.5–35.7)
MCV: 74 fL — ABNORMAL LOW (ref 79–97)
Platelets: 203 10*3/uL (ref 150–450)
RBC: 4.2 x10E6/uL (ref 3.77–5.28)
RDW: 14.9 % (ref 11.7–15.4)
WBC: 11.8 10*3/uL — ABNORMAL HIGH (ref 3.4–10.8)

## 2020-03-27 LAB — GLUCOSE TOLERANCE, 2 HOURS W/ 1HR
Glucose, 1 hour: 94 mg/dL (ref 65–179)
Glucose, 2 hour: 82 mg/dL (ref 65–152)
Glucose, Fasting: 74 mg/dL (ref 65–91)

## 2020-03-27 LAB — HIV ANTIBODY (ROUTINE TESTING W REFLEX): HIV Screen 4th Generation wRfx: NONREACTIVE

## 2020-03-27 LAB — RPR: RPR Ser Ql: NONREACTIVE

## 2020-03-29 ENCOUNTER — Other Ambulatory Visit: Payer: Self-pay

## 2020-03-29 DIAGNOSIS — O99013 Anemia complicating pregnancy, third trimester: Secondary | ICD-10-CM

## 2020-03-29 MED ORDER — FERROUS SULFATE 325 (65 FE) MG PO TBEC: 325.0000 mg | DELAYED_RELEASE_TABLET | Freq: Two times a day (BID) | ORAL | 3 refills | Status: DC

## 2020-03-30 ENCOUNTER — Encounter: Payer: Self-pay | Admitting: Advanced Practice Midwife

## 2020-04-14 ENCOUNTER — Encounter: Payer: Medicaid Other | Admitting: Advanced Practice Midwife

## 2020-04-19 ENCOUNTER — Telehealth: Payer: Self-pay | Admitting: Clinical

## 2020-04-19 NOTE — Telephone Encounter (Signed)
Follow up with pt: Pt missed last appointment, requests new OB appointment, preferably on a Friday.

## 2020-04-22 ENCOUNTER — Encounter: Payer: Self-pay | Admitting: Student

## 2020-05-03 ENCOUNTER — Ambulatory Visit (INDEPENDENT_AMBULATORY_CARE_PROVIDER_SITE_OTHER): Payer: Medicaid Other | Admitting: Family Medicine

## 2020-05-03 ENCOUNTER — Other Ambulatory Visit: Payer: Self-pay

## 2020-05-03 VITALS — BP 111/74 | HR 90 | Wt 168.6 lb

## 2020-05-03 DIAGNOSIS — Z3493 Encounter for supervision of normal pregnancy, unspecified, third trimester: Secondary | ICD-10-CM

## 2020-05-03 DIAGNOSIS — O99343 Other mental disorders complicating pregnancy, third trimester: Secondary | ICD-10-CM

## 2020-05-03 DIAGNOSIS — O99323 Drug use complicating pregnancy, third trimester: Secondary | ICD-10-CM

## 2020-05-03 DIAGNOSIS — F419 Anxiety disorder, unspecified: Secondary | ICD-10-CM

## 2020-05-03 DIAGNOSIS — F1290 Cannabis use, unspecified, uncomplicated: Secondary | ICD-10-CM

## 2020-05-03 DIAGNOSIS — O99013 Anemia complicating pregnancy, third trimester: Secondary | ICD-10-CM

## 2020-05-03 DIAGNOSIS — D509 Iron deficiency anemia, unspecified: Secondary | ICD-10-CM

## 2020-05-03 DIAGNOSIS — Z3A34 34 weeks gestation of pregnancy: Secondary | ICD-10-CM

## 2020-05-03 DIAGNOSIS — O283 Abnormal ultrasonic finding on antenatal screening of mother: Secondary | ICD-10-CM

## 2020-05-03 NOTE — Progress Notes (Signed)
Subjective:  Eileen Melton is a 24 y.o. G2P0010 at [redacted]w[redacted]d being seen today for ongoing prenatal care.  She is currently monitored for the following issues for this low-risk pregnancy and has Marijuana use; Supervision of low-risk pregnancy; Anxiety; and Echogenic intracardiac focus of fetus on prenatal ultrasound on their problem list.  Patient reports no complaints.  Contractions: Irritability. Vag. Bleeding: None.  Movement: Present. Denies leaking of fluid.   The following portions of the patient's history were reviewed and updated as appropriate: allergies, current medications, past family history, past medical history, past social history, past surgical history and problem list. Problem list updated.  Objective:   Vitals:   05/03/20 1609  BP: 111/74  Pulse: 90  Weight: 168 lb 9.6 oz (76.5 kg)    Fetal Status: Fetal Heart Rate (bpm): 154   Movement: Present     General:  Alert, oriented and cooperative. Patient is in no acute distress.  Skin: Skin is warm and dry. No rash noted.   Cardiovascular: Normal heart rate noted  Respiratory: Normal respiratory effort, no problems with respiration noted  Abdomen: Soft, gravid, appropriate for gestational age. Pain/Pressure: Present     Pelvic: Vag. Bleeding: None     Cervical exam deferred        Extremities: Normal range of motion.  Edema: None  Mental Status: Normal mood and affect. Normal behavior. Normal judgment and thought content.   Urinalysis:      Assessment and Plan:  Pregnancy: G2P0010 at [redacted]w[redacted]d  1. Encounter for supervision of low-risk pregnancy in third trimester FHT and FH normal Considering IUD Considering Waterbirth. Will sign up for class -GBS/cultures next visit  2. Echogenic intracardiac focus of fetus on prenatal ultrasound  3. Anxiety -no concerns  4. Marijuana use  5. Iron deficiency anemia On iron  Preterm labor symptoms and general obstetric precautions including but not limited to vaginal bleeding,  contractions, leaking of fluid and fetal movement were reviewed in detail with the patient. Please refer to After Visit Summary for other counseling recommendations.  No follow-ups on file.   Nugent, Odie Sera, NP

## 2020-05-03 NOTE — Patient Instructions (Addendum)
Levonorgestrel intrauterine device (IUD) What is this medicine? LEVONORGESTREL IUD (LEE voe nor jes trel) is a contraceptive (birth control) device. The device is placed inside the uterus by a healthcare professional. It is used to prevent pregnancy. This device can also be used to treat heavy bleeding that occurs during your period. This medicine may be used for other purposes; ask your health care provider or pharmacist if you have questions. COMMON BRAND NAME(S): Kyleena, LILETTA, Mirena, Skyla What should I tell my health care provider before I take this medicine? They need to know if you have any of these conditions:  abnormal Pap smear  cancer of the breast, uterus, or cervix  diabetes  endometritis  genital or pelvic infection now or in the past  have more than one sexual partner or your partner has more than one partner  heart disease  history of an ectopic or tubal pregnancy  immune system problems  IUD in place  liver disease or tumor  problems with blood clots or take blood-thinners  seizures  use intravenous drugs  uterus of unusual shape  vaginal bleeding that has not been explained  an unusual or allergic reaction to levonorgestrel, other hormones, silicone, or polyethylene, medicines, foods, dyes, or preservatives  pregnant or trying to get pregnant  breast-feeding How should I use this medicine? This device is placed inside the uterus by a health care professional. Talk to your pediatrician regarding the use of this medicine in children. Special care may be needed. Overdosage: If you think you have taken too much of this medicine contact a poison control center or emergency room at once. NOTE: This medicine is only for you. Do not share this medicine with others. What if I miss a dose? This does not apply. Depending on the brand of device you have inserted, the device will need to be replaced every 3 to 6 years if you wish to continue using this type  of birth control. What may interact with this medicine? Do not take this medicine with any of the following medications:  amprenavir  bosentan  fosamprenavir This medicine may also interact with the following medications:  aprepitant  armodafinil  barbiturate medicines for inducing sleep or treating seizures  bexarotene  boceprevir  griseofulvin  medicines to treat seizures like carbamazepine, ethotoin, felbamate, oxcarbazepine, phenytoin, topiramate  modafinil  pioglitazone  rifabutin  rifampin  rifapentine  some medicines to treat HIV infection like atazanavir, efavirenz, indinavir, lopinavir, nelfinavir, tipranavir, ritonavir  St. John's wort  warfarin This list may not describe all possible interactions. Give your health care provider a list of all the medicines, herbs, non-prescription drugs, or dietary supplements you use. Also tell them if you smoke, drink alcohol, or use illegal drugs. Some items may interact with your medicine. What should I watch for while using this medicine? Visit your doctor or health care professional for regular check ups. See your doctor if you or your partner has sexual contact with others, becomes HIV positive, or gets a sexual transmitted disease. This product does not protect you against HIV infection (AIDS) or other sexually transmitted diseases. You can check the placement of the IUD yourself by reaching up to the top of your vagina with clean fingers to feel the threads. Do not pull on the threads. It is a good habit to check placement after each menstrual period. Call your doctor right away if you feel more of the IUD than just the threads or if you cannot feel the threads at   all. The IUD may come out by itself. You may become pregnant if the device comes out. If you notice that the IUD has come out use a backup birth control method like condoms and call your health care provider. Using tampons will not change the position of the  IUD and are okay to use during your period. This IUD can be safely scanned with magnetic resonance imaging (MRI) only under specific conditions. Before you have an MRI, tell your healthcare provider that you have an IUD in place, and which type of IUD you have in place. What side effects may I notice from receiving this medicine? Side effects that you should report to your doctor or health care professional as soon as possible:  allergic reactions like skin rash, itching or hives, swelling of the face, lips, or tongue  fever, flu-like symptoms  genital sores  high blood pressure  no menstrual period for 6 weeks during use  pain, swelling, warmth in the leg  pelvic pain or tenderness  severe or sudden headache  signs of pregnancy  stomach cramping  sudden shortness of breath  trouble with balance, talking, or walking  unusual vaginal bleeding, discharge  yellowing of the eyes or skin Side effects that usually do not require medical attention (report to your doctor or health care professional if they continue or are bothersome):  acne  breast pain  change in sex drive or performance  changes in weight  cramping, dizziness, or faintness while the device is being inserted  headache  irregular menstrual bleeding within first 3 to 6 months of use  nausea This list may not describe all possible side effects. Call your doctor for medical advice about side effects. You may report side effects to FDA at 1-800-FDA-1088. Where should I keep my medicine? This does not apply. NOTE: This sheet is a summary. It may not cover all possible information. If you have questions about this medicine, talk to your doctor, pharmacist, or health care provider.  2020 Elsevier/Gold Standard (2018-10-15 13:22:01)  

## 2020-05-20 ENCOUNTER — Other Ambulatory Visit: Payer: Self-pay

## 2020-05-20 ENCOUNTER — Ambulatory Visit (INDEPENDENT_AMBULATORY_CARE_PROVIDER_SITE_OTHER): Payer: Medicaid Other | Admitting: Obstetrics and Gynecology

## 2020-05-20 ENCOUNTER — Other Ambulatory Visit (HOSPITAL_COMMUNITY)
Admission: RE | Admit: 2020-05-20 | Discharge: 2020-05-20 | Disposition: A | Discharge status: Home / Self Care | Payer: Medicaid Other | Source: Ambulatory Visit | Attending: Obstetrics and Gynecology | Admitting: Obstetrics and Gynecology

## 2020-05-20 ENCOUNTER — Other Ambulatory Visit: Payer: Self-pay | Admitting: Obstetrics and Gynecology

## 2020-05-20 VITALS — BP 112/72 | HR 108 | Wt 172.7 lb

## 2020-05-20 DIAGNOSIS — Z3A36 36 weeks gestation of pregnancy: Secondary | ICD-10-CM

## 2020-05-20 DIAGNOSIS — Z3493 Encounter for supervision of normal pregnancy, unspecified, third trimester: Secondary | ICD-10-CM | POA: Diagnosis present

## 2020-05-20 DIAGNOSIS — Z3483 Encounter for supervision of other normal pregnancy, third trimester: Secondary | ICD-10-CM

## 2020-05-20 DIAGNOSIS — S334XXA Traumatic rupture of symphysis pubis, initial encounter: Secondary | ICD-10-CM

## 2020-05-20 LAB — CBC
Hematocrit: 28.3 % — ABNORMAL LOW (ref 34.0–46.6)
Hemoglobin: 8.8 g/dL — ABNORMAL LOW (ref 11.1–15.9)
MCH: 22.1 pg — ABNORMAL LOW (ref 26.6–33.0)
MCHC: 31.1 g/dL — ABNORMAL LOW (ref 31.5–35.7)
MCV: 71 fL — ABNORMAL LOW (ref 79–97)
Platelets: 201 10*3/uL (ref 150–450)
RBC: 3.99 x10E6/uL (ref 3.77–5.28)
RDW: 16.5 % — ABNORMAL HIGH (ref 11.7–15.4)
WBC: 12.3 10*3/uL — ABNORMAL HIGH (ref 3.4–10.8)

## 2020-05-20 NOTE — Progress Notes (Signed)
° °  PRENATAL VISIT NOTE  Subjective:  Eileen Melton is a 24 y.o. G2P0010 at [redacted]w[redacted]d being seen today for ongoing prenatal care.  She is currently monitored for the following issues for this low-risk pregnancy and has Marijuana use; Supervision of low-risk pregnancy; Anxiety; and Echogenic intracardiac focus of fetus on prenatal ultrasound on their problem list.  Patient reports no complaints.  Contractions: Irritability. Vag. Bleeding: None.  Movement: Present. Denies leaking of fluid.   The following portions of the patient's history were reviewed and updated as appropriate: allergies, current medications, past family history, past medical history, past social history, past surgical history and problem list.   Objective:   Vitals:   05/20/20 1419  BP: 112/72  Pulse: (!) 108  Weight: 172 lb 11.2 oz (78.3 kg)    Fetal Status: Fetal Heart Rate (bpm): 143 Fundal Height: 35 cm Movement: Present     General:  Alert, oriented and cooperative. Patient is in no acute distress.  Skin: Skin is warm and dry. No rash noted.   Cardiovascular: Normal heart rate noted  Respiratory: Normal respiratory effort, no problems with respiration noted  Abdomen: Soft, gravid, appropriate for gestational age.  Pain/Pressure: Present     Pelvic: Cervical exam performed Dilation: Fingertip Effacement (%): Thick Station: Ballotable  Extremities: Normal range of motion.  Edema: None  Mental Status: Normal mood and affect. Normal behavior. Normal judgment and thought content.   Assessment and Plan:  Pregnancy: G2P0010 at [redacted]w[redacted]d  1. Encounter for supervision of low-risk pregnancy in third trimester  - GC/Chlamydia probe amp ([place])not at [facility] - Culture, beta strep (group b only) - CBC - Desires water birth. Class is completed, she needs CNM visit.   Preterm labor symptoms and general obstetric precautions including but not limited to vaginal bleeding, contractions, leaking of fluid and fetal movement were  reviewed in detail with the patient. Please refer to After Visit Summary for other counseling recommendations.   Return in about 1 week (around 05/27/2020) for Visit must be with CNM d/t waterbirth. Please schedule; virtual is fine. .  No future appointments.  Venia Carbon, NP

## 2020-05-21 LAB — GC/CHLAMYDIA PROBE AMP (~~LOC~~) NOT AT ARMC
Chlamydia: NEGATIVE
Comment: NEGATIVE
Comment: NORMAL
Neisseria Gonorrhea: NEGATIVE

## 2020-05-24 ENCOUNTER — Telehealth: Payer: Self-pay

## 2020-05-24 ENCOUNTER — Other Ambulatory Visit: Payer: Self-pay | Admitting: Obstetrics and Gynecology

## 2020-05-24 ENCOUNTER — Other Ambulatory Visit: Payer: Self-pay

## 2020-05-24 LAB — CULTURE, BETA STREP (GROUP B ONLY): Strep Gp B Culture: NEGATIVE

## 2020-05-24 NOTE — Telephone Encounter (Signed)
Opened to place admission order for [facility] infusion.    Addison Naegeli, RN

## 2020-05-31 ENCOUNTER — Ambulatory Visit (HOSPITAL_COMMUNITY)
Admission: RE | Admit: 2020-05-31 | Discharge: 2020-05-31 | Disposition: A | Discharge status: Home / Self Care | Payer: Medicaid Other | Source: Ambulatory Visit | Attending: Internal Medicine | Admitting: Internal Medicine

## 2020-05-31 ENCOUNTER — Other Ambulatory Visit: Payer: Self-pay

## 2020-05-31 DIAGNOSIS — O99013 Anemia complicating pregnancy, third trimester: Secondary | ICD-10-CM | POA: Diagnosis present

## 2020-05-31 DIAGNOSIS — Z3A00 Weeks of gestation of pregnancy not specified: Secondary | ICD-10-CM | POA: Diagnosis not present

## 2020-05-31 DIAGNOSIS — D509 Iron deficiency anemia, unspecified: Secondary | ICD-10-CM | POA: Insufficient documentation

## 2020-05-31 MED ORDER — SODIUM CHLORIDE 0.9 % IV SOLN
INTRAVENOUS | Status: DC | PRN
  Administered 2020-05-31: 250 mL via INTRAVENOUS

## 2020-05-31 MED ORDER — SODIUM CHLORIDE 0.9 % IV SOLN
510.0000 mg | INTRAVENOUS | Status: DC
  Administered 2020-05-31: 510 mg via INTRAVENOUS
  Filled 2020-05-31: qty 510

## 2020-05-31 NOTE — Discharge Instructions (Signed)

## 2020-05-31 NOTE — Progress Notes (Signed)
PATIENT CARE CENTER NOTE  Diagnosis: Iron Deficiency Anemia    Provider: Candelaria Celeste, DO   Procedure: IV Feraheme    Note: Patient received Feraheme infusion via PIV. Tolerated well with no adverse reaction. Observed patient for 30 minutes post-infusion. Vital signs stable. Discharge instructions given. Patient to come back next week for second infusion. Alert, oriented and ambulatory at discharge.

## 2020-06-01 ENCOUNTER — Ambulatory Visit (INDEPENDENT_AMBULATORY_CARE_PROVIDER_SITE_OTHER): Payer: Medicaid Other | Admitting: Student

## 2020-06-01 DIAGNOSIS — Z3493 Encounter for supervision of normal pregnancy, unspecified, third trimester: Secondary | ICD-10-CM

## 2020-06-01 DIAGNOSIS — Z3A38 38 weeks gestation of pregnancy: Secondary | ICD-10-CM

## 2020-06-01 DIAGNOSIS — Z3483 Encounter for supervision of other normal pregnancy, third trimester: Secondary | ICD-10-CM

## 2020-06-01 NOTE — Progress Notes (Addendum)
     PRENATAL VISIT NOTE  Subjective:  Eileen Melton is a 24 y.o. G2P0010 at [redacted]w[redacted]d being seen today for ongoing prenatal care.  She is currently monitored for the following issues for this low-risk pregnancy and has Marijuana use; Supervision of low-risk pregnancy; Anxiety; and Echogenic intracardiac focus of fetus on prenatal ultrasound on their problem list.  Patient reports no complaints. She is checking BP at home; always "low".  Contractions: Irritability. Vag. Bleeding: None.  Movement: Present. Denies leaking of fluid.   The following portions of the patient's history were reviewed and updated as appropriate: allergies, current medications, past family history, past medical history, past social history, past surgical history and problem list.   Objective:   Vitals:   06/01/20 1133  BP: 122/74  Pulse: 91  Weight: 171 lb 12.8 oz (77.9 kg)    Fetal Status: Fetal Heart Rate (bpm): 132 Fundal Height: 38 cm Movement: Present  Presentation: Vertex  General:  Alert, oriented and cooperative. Patient is in no acute distress.  Skin: Skin is warm and dry. No rash noted.   Cardiovascular: Normal heart rate noted  Respiratory: Normal respiratory effort, no problems with respiration noted  Abdomen: Soft, gravid, appropriate for gestational age.  Pain/Pressure: Present     Pelvic: Cervical exam performed in the presence of a chaperone Dilation: Fingertip      Extremities: Normal range of motion.  Edema: Trace  Mental Status: Normal mood and affect. Normal behavior. Normal judgment and thought content.   Assessment and Plan:  Pregnancy: G2P0010 at [redacted]w[redacted]d 1. Encounter for supervision of low-risk pregnancy in third trimester  -Pt signed up consent; RN will scan -Pt brought certificate, RN will scan -Patient has peds, will choose pediatrician and bring to next visit -Patient knows how to check BP; checkes weekly and verbalized warning signs and BP values to be concerned about.  -Confirmed  vertex by BSUS -Reviewed warning signs of labor and when to come to hospital  Term labor symptoms and general obstetric precautions including but not limited to vaginal bleeding, contractions, leaking of fluid and fetal movement were reviewed in detail with the patient. Please refer to After Visit Summary for other counseling recommendations.   No follow-ups on file.  Future Appointments  Date Time Provider Department Center  06/07/2020 11:00 AM [facility] RM 1 [facility] None  06/08/2020  1:35 PM Kathlene Cote [facility]  06/15/2020  8:35 AM Marylene Land, CNM [facility]  07/13/2020 10:30 AM Theressa Millard, PT [facility]  07/20/2020 10:30 AM Theressa Millard, PT [facility]  07/27/2020 10:30 AM Theressa Millard, PT [facility]  08/03/2020 10:30 AM Theressa Millard, PT [facility]  08/10/2020 10:30 AM Theressa Millard, PT [facility]    Marylene Land, [place]

## 2020-06-07 ENCOUNTER — Other Ambulatory Visit: Payer: Self-pay

## 2020-06-07 ENCOUNTER — Ambulatory Visit (HOSPITAL_COMMUNITY)
Admission: RE | Admit: 2020-06-07 | Discharge: 2020-06-07 | Disposition: A | Discharge status: Home / Self Care | Payer: Medicaid Other | Source: Ambulatory Visit | Attending: Internal Medicine | Admitting: Internal Medicine

## 2020-06-07 DIAGNOSIS — O99013 Anemia complicating pregnancy, third trimester: Secondary | ICD-10-CM | POA: Diagnosis not present

## 2020-06-07 MED ORDER — SODIUM CHLORIDE 0.9 % IV SOLN
510.0000 mg | Freq: Once | INTRAVENOUS | Status: AC
  Administered 2020-06-07: 510 mg via INTRAVENOUS
  Filled 2020-06-07: qty 510

## 2020-06-07 MED ORDER — SODIUM CHLORIDE 0.9 % IV SOLN
INTRAVENOUS | Status: DC | PRN
  Administered 2020-06-07: 250 mL via INTRAVENOUS

## 2020-06-07 NOTE — Progress Notes (Signed)
Patient received IV Feraheme as ordered by Candelaria Celeste DO. Observed for at least 30 minutes post infusion.Tolerated well, vitals stable, discharge instructions given, verbalized understanding. Patient alert, oriented and ambulatory at the time of discharge.

## 2020-06-07 NOTE — Discharge Instructions (Signed)

## 2020-06-08 ENCOUNTER — Ambulatory Visit (INDEPENDENT_AMBULATORY_CARE_PROVIDER_SITE_OTHER): Payer: Medicaid Other | Admitting: Medical

## 2020-06-08 ENCOUNTER — Encounter: Payer: Self-pay | Admitting: Medical

## 2020-06-08 VITALS — BP 120/70 | HR 91 | Wt 174.6 lb

## 2020-06-08 DIAGNOSIS — Z3A39 39 weeks gestation of pregnancy: Secondary | ICD-10-CM

## 2020-06-08 DIAGNOSIS — Z3483 Encounter for supervision of other normal pregnancy, third trimester: Secondary | ICD-10-CM

## 2020-06-08 DIAGNOSIS — Z3493 Encounter for supervision of normal pregnancy, unspecified, third trimester: Secondary | ICD-10-CM

## 2020-06-08 DIAGNOSIS — O283 Abnormal ultrasonic finding on antenatal screening of mother: Secondary | ICD-10-CM

## 2020-06-08 NOTE — Progress Notes (Signed)
Induction Assessment Scheduling Form: Fax to [facility]:  [phone] Route to [facility] Labor Delivery   Eileen Melton                                                                                   DOB:  05-Jul-1996                                                            MRN:  [id number]  Phone:  Home Phone [phone]  Mobile [phone]    Provider:  [facility] (Faculty Practice)  Admission Date/Time:  7/3 midnight GP:  G2P0010     Gestational age on admission:  66.6                                                Estimated Date of Delivery: 06/13/20  Dating Criteria: LMP  Filed Weights   06/08/20 1347  Weight: 174 lb 9.6 oz (79.2 kg)    GBS: Negative/-- (06/03 1429) HIV:  Non Reactive (04/09 0920)  Reason for induction:  Post Dates pregnancy Scheduling Provider Signature:  Vonzella Nipple, PA-C       Cervix: 0.5/50/bal  Method of induction(proposed):  considering outpatient Foley   Scheduling Provider Signature:  Vonzella Nipple, PA-C                                            Today's Date:  06/08/2020

## 2020-06-08 NOTE — Patient Instructions (Addendum)
Fetal Movement Counts Patient Name: ________________________________________________ Patient Due Date: ____________________ What is a fetal movement count?  A fetal movement count is the number of times that you feel your baby move during a certain amount of time. This may also be called a fetal kick count. A fetal movement count is recommended for every pregnant woman. You may be asked to start counting fetal movements as early as week 28 of your pregnancy. Pay attention to when your baby is most active. You may notice your baby's sleep and wake cycles. You may also notice things that make your baby move more. You should do a fetal movement count:  When your baby is normally most active.  At the same time each day. A good time to count movements is while you are resting, after having something to eat and drink. How do I count fetal movements? 1. Find a quiet, comfortable area. Sit, or lie down on your side. 2. Write down the date, the start time and stop time, and the number of movements that you felt between those two times. Take this information with you to your health care visits. 3. Write down your start time when you feel the first movement. 4. Count kicks, flutters, swishes, rolls, and jabs. You should feel at least 10 movements. 5. You may stop counting after you have felt 10 movements, or if you have been counting for 2 hours. Write down the stop time. 6. If you do not feel 10 movements in 2 hours, contact your health care provider for further instructions. Your health care provider may want to do additional tests to assess your baby's well-being. Contact a health care provider if:  You feel fewer than 10 movements in 2 hours.  Your baby is not moving like he or she usually does. Date: ____________ Start time: ____________ Stop time: ____________ Movements: ____________ Date: ____________ Start time: ____________ Stop time: ____________ Movements: ____________ Date: ____________  Start time: ____________ Stop time: ____________ Movements: ____________ Date: ____________ Start time: ____________ Stop time: ____________ Movements: ____________ Date: ____________ Start time: ____________ Stop time: ____________ Movements: ____________ Date: ____________ Start time: ____________ Stop time: ____________ Movements: ____________ Date: ____________ Start time: ____________ Stop time: ____________ Movements: ____________ Date: ____________ Start time: ____________ Stop time: ____________ Movements: ____________ Date: ____________ Start time: ____________ Stop time: ____________ Movements: ____________ This information is not intended to replace advice given to you by your health care provider. Make sure you discuss any questions you have with your health care provider. Document Revised: 07/24/2019 Document Reviewed: 07/24/2019 Elsevier Patient Education  2020 Else[employer]. Braxton Hicks Contractions Contractions of the uterus can occur throughout pregnancy, but they are not always a sign that you are in labor. You may have practice contractions called Braxton Hicks contractions. These false labor contractions are sometimes confused with true labor. What are Braxton Hicks contractions? Braxton Hicks contractions are tightening movements that occur in the muscles of the uterus before labor. Unlike true labor contractions, these contractions do not result in opening (dilation) and thinning of the cervix. Toward the end of pregnancy (32-34 weeks), Braxton Hicks contractions can happen more often and may become stronger. These contractions are sometimes difficult to tell apart from true labor because they can be very uncomfortable. You should not feel embarrassed if you go to the hospital with false labor. Sometimes, the only way to tell if you are in true labor is for your health care provider to look for changes in the cervix. The health care provider   will do a physical exam and may  monitor your contractions. If you are not in true labor, the exam should show that your cervix is not dilating and your water has not broken. If there are no other health problems associated with your pregnancy, it is completely safe for you to be sent home with false labor. You may continue to have Braxton Hicks contractions until you go into true labor. How to tell the difference between true labor and false labor True labor  Contractions last 30-70 seconds.  Contractions become very regular.  Discomfort is usually felt in the top of the uterus, and it spreads to the lower abdomen and low back.  Contractions do not go away with walking.  Contractions usually become more intense and increase in frequency.  The cervix dilates and gets thinner. False labor  Contractions are usually shorter and not as strong as true labor contractions.  Contractions are usually irregular.  Contractions are often felt in the front of the lower abdomen and in the groin.  Contractions may go away when you walk around or change positions while lying down.  Contractions get weaker and are shorter-lasting as time goes on.  The cervix usually does not dilate or become thin. Follow these instructions at home:   Take over-the-counter and prescription medicines only as told by your health care provider.  Keep up with your usual exercises and follow other instructions from your health care provider.  Eat and drink lightly if you think you are going into labor.  If Braxton Hicks contractions are making you uncomfortable: ? Change your position from lying down or resting to walking, or change from walking to resting. ? Sit and rest in a tub of warm water. ? Drink enough fluid to keep your urine pale yellow. Dehydration may cause these contractions. ? Do slow and deep breathing several times an hour.  Keep all follow-up prenatal visits as told by your health care provider. This is important. Contact a  health care provider if:  You have a fever.  You have continuous pain in your abdomen. Get help right away if:  Your contractions become stronger, more regular, and closer together.  You have fluid leaking or gushing from your vagina.  You pass blood-tinged mucus (bloody show).  You have bleeding from your vagina.  You have low back pain that you never had before.  You feel your baby's head pushing down and causing pelvic pressure.  Your baby is not moving inside you as much as it used to. Summary  Contractions that occur before labor are called Braxton Hicks contractions, false labor, or practice contractions.  Braxton Hicks contractions are usually shorter, weaker, farther apart, and less regular than true labor contractions. True labor contractions usually become progressively stronger and regular, and they become more frequent.  Manage discomfort from [facility] contractions by changing position, resting in a warm bath, drinking plenty of water, or practicing deep breathing. This information is not intended to replace advice given to you by your health care provider. Make sure you discuss any questions you have with your health care provider. Document Revised: 11/16/2017 Document Reviewed: 04/19/2017 Elsevier Patient Education  [employer].    Cervical Ripening (to get your cervix ready for labor) : May try one or all:  Red Raspberry Leaf capsules:  two 300mg  or 400mg  tablets with each meal, 2-3 times a day  Potential Side Effects Of Raspberry Leaf:  Most women do not experience any side effects  from drinking raspberry leaf tea. However, nausea and loose stools are possible     Evening Primrose Oil capsules: may take 1 to 3 capsules daily. May also prick one to release the oil and insert it into your vagina at night.  Some of the potential side effects:  Upset stomach  Loose stools or diarrhea  Headaches  Nausea   4 Dates a day (may taste better if  warmed in microwave until soft). Found where raisins are in the grocery store  

## 2020-06-08 NOTE — Progress Notes (Signed)
   PRENATAL VISIT NOTE  Subjective:  Eileen Melton is a 24 y.o. G2P0010 at [redacted]w[redacted]d being seen today for ongoing prenatal care.  She is currently monitored for the following issues for this low-risk pregnancy and has Marijuana use; Supervision of low-risk pregnancy; Anxiety; and Echogenic intracardiac focus of fetus on prenatal ultrasound on their problem list.  Patient reports no complaints.  Contractions: Irritability. Vag. Bleeding: None.  Movement: Absent. Denies leaking of fluid.   The following portions of the patient's history were reviewed and updated as appropriate: allergies, current medications, past family history, past medical history, past social history, past surgical history and problem list.   Objective:   Vitals:   06/08/20 1347  BP: 120/70  Pulse: 91  Weight: 174 lb 9.6 oz (79.2 kg)    Fetal Status: Fetal Heart Rate (bpm): 133 Fundal Height: 38 cm Movement: Absent  Presentation: Vertex  General:  Alert, oriented and cooperative. Patient is in no acute distress.  Skin: Skin is warm and dry. No rash noted.   Cardiovascular: Normal heart rate noted  Respiratory: Normal respiratory effort, no problems with respiration noted  Abdomen: Soft, gravid, appropriate for gestational age.  Pain/Pressure: Present     Pelvic: Cervical exam performed in the presence of a chaperone Dilation: Fingertip Effacement (%): 50 Station: Ballotable  Extremities: Normal range of motion.  Edema: Trace  Mental Status: Normal mood and affect. Normal behavior. Normal judgment and thought content.   Assessment and Plan:  Pregnancy: G2P0010 at [redacted]w[redacted]d 1. Encounter for supervision of low-risk pregnancy in third trimester - Doing well - Plan for IOL at 40.6 due to holiday  - Discussed importance of choosing a Peds - Declines MOC, planning to breastfeed  - GBS negative  2. Echogenic intracardiac focus of fetus on prenatal ultrasound - Resolved on follow-up US  Term labor symptoms and general  obstetric precautions including but not limited to vaginal bleeding, contractions, leaking of fluid and fetal movement were reviewed in detail with the patient. Please refer to After Visit Summary for other counseling recommendations.   Return in about 1 week (around 06/15/2020) for LOB, In-Person, NST/BPP.  Future Appointments  Date Time Provider Department Center  06/15/2020  8:35 AM Marylene Land, CNM [facility]  07/13/2020 10:30 AM Theressa Millard, PT [facility]  07/20/2020 10:30 AM Theressa Millard, PT [facility]  08/03/2020 10:30 AM Theressa Millard, PT [facility]  08/10/2020 10:30 AM Theressa Millard, PT [facility]    Vonzella Nipple, PA-C

## 2020-06-09 ENCOUNTER — Telehealth (HOSPITAL_COMMUNITY): Payer: Self-pay | Admitting: *Deleted

## 2020-06-09 NOTE — Telephone Encounter (Signed)
Preadmission screen  

## 2020-06-11 ENCOUNTER — Other Ambulatory Visit: Payer: Self-pay | Admitting: Advanced Practice Midwife

## 2020-06-14 ENCOUNTER — Encounter: Payer: Self-pay | Admitting: Student

## 2020-06-14 ENCOUNTER — Telehealth (HOSPITAL_COMMUNITY): Payer: Self-pay | Admitting: *Deleted

## 2020-06-14 NOTE — Telephone Encounter (Signed)
Preadmission screen  

## 2020-06-15 ENCOUNTER — Encounter: Payer: Self-pay | Admitting: Student

## 2020-06-15 ENCOUNTER — Telehealth: Payer: Self-pay | Admitting: Student

## 2020-06-15 NOTE — Telephone Encounter (Signed)
Called patient to discuss her concerns about dating; reviewed [place] results with Dr. Adrian Blackwater who finds that it is appropriate to change due date based to 06/20/2020.   Informed patient that her NST and IOL will be changed to next week.  Patient agreeable to plan of care.   Luna Kitchens

## 2020-06-16 ENCOUNTER — Other Ambulatory Visit: Payer: Self-pay

## 2020-06-16 ENCOUNTER — Other Ambulatory Visit: Payer: Medicaid Other

## 2020-06-16 ENCOUNTER — Ambulatory Visit (INDEPENDENT_AMBULATORY_CARE_PROVIDER_SITE_OTHER): Payer: Medicaid Other | Admitting: Certified Nurse Midwife

## 2020-06-16 ENCOUNTER — Encounter (HOSPITAL_COMMUNITY): Payer: Self-pay | Admitting: *Deleted

## 2020-06-16 VITALS — BP 112/56 | HR 87 | Wt 175.1 lb

## 2020-06-16 DIAGNOSIS — Z3493 Encounter for supervision of normal pregnancy, unspecified, third trimester: Secondary | ICD-10-CM

## 2020-06-16 DIAGNOSIS — Z3A39 39 weeks gestation of pregnancy: Secondary | ICD-10-CM

## 2020-06-16 NOTE — Patient Instructions (Signed)

## 2020-06-16 NOTE — Progress Notes (Signed)
   PRENATAL VISIT NOTE  Subjective:  Eileen Melton is a 24 y.o. G2P0010 at [redacted]w[redacted]d being seen today for ongoing prenatal care.  She is currently monitored for the following issues for this low-risk pregnancy and has Marijuana use; Supervision of low-risk pregnancy; Anxiety; and Echogenic intracardiac focus of fetus on prenatal ultrasound on their problem list.  Patient reports no contractions, no leaking and occasional contractions.  Contractions: Irritability. Vag. Bleeding: None.  Movement: Present. Denies leaking of fluid.   The following portions of the patient's history were reviewed and updated as appropriate: allergies, current medications, past family history, past medical history, past social history, past surgical history and problem list.   Objective:   Vitals:   06/16/20 1139  BP: (!) 112/56  Pulse: 87  Weight: 175 lb 1.6 oz (79.4 kg)    Fetal Status: Fetal Heart Rate (bpm): 130 Fundal Height: 39 cm Movement: Present  Presentation: Vertex  General:  Alert, oriented and cooperative. Patient is in no acute distress.  Skin: Skin is warm and dry. No rash noted.   Cardiovascular: Normal heart rate noted  Respiratory: Normal respiratory effort, no problems with respiration noted  Abdomen: Soft, gravid, appropriate for gestational age.  Pain/Pressure: Present     Pelvic: Cervical exam performed in the presence of a chaperone Dilation: Fingertip Effacement (%): 50 Station: Ballotable  Extremities: Normal range of motion.  Edema: None  Mental Status: Normal mood and affect. Normal behavior. Normal judgment and thought content.   Assessment and Plan:  Pregnancy: G2P0010 at [redacted]w[redacted]d 1. Encounter for supervision of low-risk pregnancy in third trimester - Discussed birth plan and gave general reassurance - Encouraged to continue eating dates, walking, sex, and drinking red raspberry leaf tea. Will begin oral evening primrose oil at [redacted]wks gestation.  Term labor symptoms and general  obstetric precautions including but not limited to vaginal bleeding, contractions, leaking of fluid and fetal movement were reviewed in detail with the patient. Please refer to After Visit Summary for other counseling recommendations.   Return in about 1 week (around 06/23/2020) for w NST.  Future Appointments  Date Time Provider Department Center  06/17/2020  9:40 AM [facility] None  06/24/2020  8:55 AM Rasch, Harolyn Rutherford, NP [facility]  06/24/2020 10:15 AM [facility] NST [facility]  06/27/2020 12:00 AM [facility] SCHED ROOM [facility]DC None  07/13/2020 10:30 AM Theressa Millard, PT [facility]  07/20/2020 10:30 AM Theressa Millard, PT [facility]  08/03/2020 10:30 AM Theressa Millard, PT [facility]  08/10/2020 10:30 AM Theressa Millard, PT [facility]    Bernerd Limbo, CNM

## 2020-06-17 ENCOUNTER — Other Ambulatory Visit (HOSPITAL_COMMUNITY): Payer: Medicaid Other

## 2020-06-19 ENCOUNTER — Inpatient Hospital Stay (HOSPITAL_COMMUNITY): Payer: Medicaid Other

## 2020-06-20 ENCOUNTER — Other Ambulatory Visit: Payer: Self-pay | Admitting: Women's Health

## 2020-06-22 ENCOUNTER — Ambulatory Visit (HOSPITAL_COMMUNITY)
Admission: EM | Admit: 2020-06-22 | Discharge: 2020-06-22 | Disposition: A | Discharge status: Home / Self Care | Payer: Medicaid Other | Attending: Family Medicine | Admitting: Family Medicine

## 2020-06-22 ENCOUNTER — Encounter (HOSPITAL_COMMUNITY): Payer: Self-pay

## 2020-06-22 ENCOUNTER — Other Ambulatory Visit: Payer: Self-pay

## 2020-06-22 DIAGNOSIS — B353 Tinea pedis: Secondary | ICD-10-CM | POA: Diagnosis not present

## 2020-06-22 MED ORDER — CLOTRIMAZOLE 1 % EX CREA
TOPICAL_CREAM | CUTANEOUS | 0 refills | Status: DC
Start: 2020-06-22 — End: 2020-07-27

## 2020-06-22 NOTE — Discharge Instructions (Addendum)
Use the cream as prescribed for the next few weeks. Keep the feet clean and dry as possible.  Follow up as needed for continued or worsening symptoms

## 2020-06-22 NOTE — ED Triage Notes (Signed)
Pt reports she noticed raised area of skin in between 3r/4th and 4th/5th toes of right foot today. Denies itching/pain.

## 2020-06-23 NOTE — ED Provider Notes (Signed)
[facility]    CSN: [id number] Arrival date & time: 06/22/20  1405      History   Chief Complaint Chief Complaint  Patient presents with  . foot rash    HPI Eileen Melton is a 24 y.o. female.   Patient is a 24 year old female presents today with rash between the third/fourth and fourth and fifth toes on the right foot.  She noticed this a few days prior.  Believes she may have been exposed to athlete's foot.  The rash is itchy.  Denies any pain, drainage.  She has not tried anything for her symptoms.  ROS per HPI      Past Medical History:  Diagnosis Date  . Anxiety    per patient    Patient Active Problem List   Diagnosis Date Noted  . Echogenic intracardiac focus of fetus on prenatal ultrasound 03/09/2020  . Supervision of low-risk pregnancy 12/10/2019  . Anxiety 12/10/2019  . Marijuana use 10/21/2018    Past Surgical History:  Procedure Laterality Date  . NO PAST SURGERIES    . WISDOM TOOTH EXTRACTION      OB History    Gravida  2   Para  0   Term  0   Preterm  0   AB  1   Living  0     SAB  0   TAB  1   Ectopic  0   Multiple  0   Live Births  0            Home Medications    Prior to Admission medications   Medication Sig Start Date End Date Taking? Authorizing Provider  ferrous sulfate 325 (65 FE) MG EC tablet Take 1 tablet (325 mg total) by mouth 2 (two) times daily with a meal. 03/29/20  Yes Gerrit Heck, CNM  Prenatal Vit-Fe Fumarate-FA (MULTIVITAMIN-PRENATAL) 27-0.8 MG TABS tablet Take 1 tablet by mouth daily at 12 noon.   Yes [provider]  clotrimazole (LOTRIMIN) 1 % cream Apply to affected area 2 times daily 06/22/20   Janace Aris, NP    Family History Family History  Problem Relation Age of Onset  . Aneurysm Mother   . Cancer Maternal Grandmother   . Arthritis Father   . Hypertension Father   . Hyperlipidemia Father     Social History Social History   Tobacco Use  . Smoking status:  Former Smoker    Types: Cigars    Quit date: 10/10/2019    Years since quitting: 0.7  . Smokeless tobacco: Never Used  Vaping Use  . Vaping Use: Never used  Substance Use Topics  . Alcohol use: Not Currently    Comment: holidays  . Drug use: Not Currently    Frequency: 7.0 times per week    Types: Marijuana    Comment: stopped + preg- 10/20     Allergies   Hydrocodone-acetaminophen and Kiwi extract   Review of Systems Review of Systems   Physical Exam Triage Vital Signs ED Triage Vitals  Enc Vitals Group     BP 06/22/20 1504 129/72     Pulse Rate 06/22/20 1504 96     Resp 06/22/20 1504 16     Temp 06/22/20 1504 98.6 F (37 C)     Temp Source 06/22/20 1504 Oral     SpO2 06/22/20 1504 99 %     Weight --      Height --      Head  Circumference --      Peak Flow --      Pain Score 06/22/20 1502 0     Pain Loc --      Pain Edu? --      Excl. in GC? --    No data found.  Updated Vital Signs BP 129/72 (BP Location: Right Arm)   Pulse 96   Temp 98.6 F (37 C) (Oral)   Resp 16   LMP 09/07/2019 (Approximate)   SpO2 99%   Visual Acuity Right Eye Distance:   Left Eye Distance:   Bilateral Distance:    Right Eye Near:   Left Eye Near:    Bilateral Near:     Physical Exam Vitals and nursing note reviewed.  Constitutional:      General: She is not in acute distress.    Appearance: Normal appearance. She is not ill-appearing, toxic-appearing or diaphoretic.  HENT:     Head: Normocephalic.     Nose: Nose normal.  Eyes:     Conjunctiva/sclera: Conjunctivae normal.  Pulmonary:     Effort: Pulmonary effort is normal.  Musculoskeletal:        General: Normal range of motion.     Cervical back: Normal range of motion.  Skin:    General: Skin is warm and dry.     Findings: Rash present.     Comments: Mild scaling in between the third and fourth along with fourth and fifth toes on the right foot No significant erythema or drainage from the feet.  No  swelling.  Neurological:     Mental Status: She is alert.  Psychiatric:        Mood and Affect: Mood normal.      UC Treatments / Results  Labs (all labs ordered are listed, but only abnormal results are displayed) Labs Reviewed - No data to display  EKG   Radiology No results found.  Procedures Procedures (including critical care time)  Medications Ordered in UC Medications - No data to display  Initial Impression / Assessment and Plan / UC Course  I have reviewed the triage vital signs and the nursing notes.  Pertinent labs & imaging results that were available during my care of the patient were reviewed by me and considered in my medical decision making (see chart for details).     Tinea pedis Treated with clotrimazole. Follow up as needed for continued or worsening symptoms  Final Clinical Impressions(s) / UC Diagnoses   Final diagnoses:  Tinea pedis of right foot     Discharge Instructions     Use the cream as prescribed for the next few weeks. Keep the feet clean and dry as possible.  Follow up as needed for continued or worsening symptoms     ED Prescriptions    Medication Sig Dispense Auth. Provider   clotrimazole (LOTRIMIN) 1 % cream Apply to affected area 2 times daily 15 g Riku Buttery A, NP     PDMP not reviewed this encounter.   [name] A, NP 06/23/20 1107

## 2020-06-24 ENCOUNTER — Ambulatory Visit: Payer: Self-pay

## 2020-06-24 ENCOUNTER — Other Ambulatory Visit: Payer: Self-pay

## 2020-06-24 ENCOUNTER — Ambulatory Visit (INDEPENDENT_AMBULATORY_CARE_PROVIDER_SITE_OTHER): Payer: Medicaid Other | Admitting: Obstetrics and Gynecology

## 2020-06-24 ENCOUNTER — Ambulatory Visit (INDEPENDENT_AMBULATORY_CARE_PROVIDER_SITE_OTHER): Payer: Medicaid Other | Admitting: *Deleted

## 2020-06-24 VITALS — BP 115/58 | HR 96 | Wt 180.0 lb

## 2020-06-24 DIAGNOSIS — Z3A40 40 weeks gestation of pregnancy: Secondary | ICD-10-CM | POA: Diagnosis not present

## 2020-06-24 DIAGNOSIS — O480 Post-term pregnancy: Secondary | ICD-10-CM

## 2020-06-24 DIAGNOSIS — Z3493 Encounter for supervision of normal pregnancy, unspecified, third trimester: Secondary | ICD-10-CM

## 2020-06-24 NOTE — Progress Notes (Signed)
   PRENATAL VISIT NOTE  Subjective:  Eileen Melton is a 24 y.o. G2P0010 at [redacted]w[redacted]d being seen today for ongoing prenatal care.  She is currently monitored for the following issues for this low-risk pregnancy and has Marijuana use; Supervision of low-risk pregnancy; Anxiety; and Echogenic intracardiac focus of fetus on prenatal ultrasound on their problem list.  Patient reports no complaints.  Contractions: Irritability. Vag. Bleeding: None.  Movement: Present. Denies leaking of fluid.   The following portions of the patient's history were reviewed and updated as appropriate: allergies, current medications, past family history, past medical history, past social history, past surgical history and problem list.   Objective:   Vitals:   06/24/20 0918  BP: (!) 115/58  Pulse: 96  Weight: 180 lb (81.6 kg)    Fetal Status: Fetal Heart Rate (bpm): 137 Fundal Height: 39 cm Movement: Present  Presentation: Vertex  General:  Alert, oriented and cooperative. Patient is in no acute distress.  Skin: Skin is warm and dry. No rash noted.   Cardiovascular: Normal heart rate noted  Respiratory: Normal respiratory effort, no problems with respiration noted  Abdomen: Soft, gravid, appropriate for gestational age.  Pain/Pressure: Present     Pelvic: Cervical exam performed in the presence of a chaperone Dilation: Fingertip Effacement (%): 50 Station: Ballotable  Extremities: Normal range of motion.  Edema: Trace  Mental Status: Normal mood and affect. Normal behavior. Normal judgment and thought content.   Assessment and Plan:  Pregnancy: G2P0010 at [redacted]w[redacted]d 1. Encounter for supervision of low-risk pregnancy in third trimester  7/10: Patient to go to MAU around 12:00 noon for a foley bulb insertion.  Patient called the office back and declined coming into MAU for foley insertion. She prefers to arrive at her regular scheduled induction time.  GBS negative   Term labor symptoms and general obstetric  precautions including but not limited to vaginal bleeding, contractions, leaking of fluid and fetal movement were reviewed in detail with the patient. Please refer to After Visit Summary for other counseling recommendations.   No follow-ups on file.  Future Appointments  Date Time Provider Department Center  06/27/2020 12:00 AM [facility] SCHED ROOM [facility] None  07/13/2020 10:30 AM Theressa Millard, PT [facility]  07/20/2020 10:30 AM Theressa Millard, PT [facility]  08/03/2020 10:30 AM Theressa Millard, PT [facility]  08/10/2020 10:30 AM Theressa Millard, PT [facility]    Venia Carbon, NP

## 2020-06-24 NOTE — Progress Notes (Signed)
NST:  Baseline: 120 bpm, Variability: Good {> 6 bpm), Accelerations: Reactive and Decelerations: Absent

## 2020-06-25 ENCOUNTER — Other Ambulatory Visit (HOSPITAL_COMMUNITY)
Admission: RE | Admit: 2020-06-25 | Discharge: 2020-06-25 | Disposition: A | Discharge status: Home / Self Care | Payer: Medicaid Other | Source: Ambulatory Visit | Attending: Family Medicine | Admitting: Family Medicine

## 2020-06-25 LAB — SARS CORONAVIRUS 2 (TAT 6-24 HRS): SARS Coronavirus 2: NEGATIVE

## 2020-06-26 ENCOUNTER — Encounter (HOSPITAL_COMMUNITY): Payer: Self-pay | Admitting: Obstetrics & Gynecology

## 2020-06-26 ENCOUNTER — Other Ambulatory Visit: Payer: Self-pay

## 2020-06-26 ENCOUNTER — Other Ambulatory Visit: Payer: Self-pay | Admitting: Advanced Practice Midwife

## 2020-06-26 ENCOUNTER — Inpatient Hospital Stay (HOSPITAL_COMMUNITY)
Admit: 2020-06-26 | Discharge: 2020-06-30 | DRG: 807 | Disposition: A | Discharge status: Home / Self Care | Payer: Medicaid Other | Attending: Obstetrics and Gynecology | Admitting: Obstetrics and Gynecology

## 2020-06-26 DIAGNOSIS — Z3A41 41 weeks gestation of pregnancy: Secondary | ICD-10-CM

## 2020-06-26 DIAGNOSIS — F419 Anxiety disorder, unspecified: Secondary | ICD-10-CM | POA: Diagnosis present

## 2020-06-26 DIAGNOSIS — Z20822 Contact with and (suspected) exposure to covid-19: Secondary | ICD-10-CM | POA: Diagnosis present

## 2020-06-26 DIAGNOSIS — O99344 Other mental disorders complicating childbirth: Secondary | ICD-10-CM | POA: Diagnosis present

## 2020-06-26 DIAGNOSIS — O480 Post-term pregnancy: Secondary | ICD-10-CM | POA: Diagnosis present

## 2020-06-26 DIAGNOSIS — O26893 Other specified pregnancy related conditions, third trimester: Secondary | ICD-10-CM | POA: Diagnosis present

## 2020-06-26 DIAGNOSIS — O4202 Full-term premature rupture of membranes, onset of labor within 24 hours of rupture: Secondary | ICD-10-CM | POA: Diagnosis not present

## 2020-06-26 DIAGNOSIS — Z87891 Personal history of nicotine dependence: Secondary | ICD-10-CM

## 2020-06-26 DIAGNOSIS — O283 Abnormal ultrasonic finding on antenatal screening of mother: Secondary | ICD-10-CM | POA: Diagnosis present

## 2020-06-26 HISTORY — DX: Post-term pregnancy: O48.0

## 2020-06-26 MED ORDER — MISOPROSTOL 25 MCG QUARTER TABLET: 25.0000 ug | ORAL_TABLET | ORAL | Status: DC | PRN

## 2020-06-26 MED ORDER — TERBUTALINE SULFATE 1 MG/ML IJ SOLN: 0.2500 mg | Freq: Once | INTRAMUSCULAR | Status: DC | PRN

## 2020-06-26 MED ORDER — OXYTOCIN-SODIUM CHLORIDE 30-0.9 UT/500ML-% IV SOLN
2.5000 [IU]/h | INTRAVENOUS | Status: DC
  Administered 2020-06-28: 2.5 [IU]/h via INTRAVENOUS
  Filled 2020-06-26: qty 500

## 2020-06-26 MED ORDER — SOD CITRATE-CITRIC ACID 500-334 MG/5ML PO SOLN: 30.0000 mL | ORAL | Status: DC | PRN

## 2020-06-26 MED ORDER — LACTATED RINGERS IV SOLN: 500.0000 mL | INTRAVENOUS | Status: DC | PRN

## 2020-06-26 MED ORDER — LACTATED RINGERS IV SOLN: INTRAVENOUS | Status: DC

## 2020-06-26 MED ORDER — ONDANSETRON HCL 4 MG/2ML IJ SOLN: 4.0000 mg | Freq: Four times a day (QID) | INTRAMUSCULAR | Status: DC | PRN

## 2020-06-26 MED ORDER — OXYTOCIN BOLUS FROM INFUSION
333.0000 mL | Freq: Once | INTRAVENOUS | Status: AC
  Administered 2020-06-28: 333 mL via INTRAVENOUS

## 2020-06-26 MED ORDER — LIDOCAINE HCL (PF) 1 % IJ SOLN
30.0000 mL | INTRAMUSCULAR | Status: AC | PRN
  Administered 2020-06-28: 30 mL via SUBCUTANEOUS
  Filled 2020-06-26: qty 30

## 2020-06-27 ENCOUNTER — Inpatient Hospital Stay (HOSPITAL_COMMUNITY): Payer: Medicaid Other

## 2020-06-27 LAB — TYPE AND SCREEN
ABO/RH(D): A POS
Antibody Screen: NEGATIVE

## 2020-06-27 LAB — CBC
HCT: 36.8 % (ref 36.0–46.0)
Hemoglobin: 11.4 g/dL — ABNORMAL LOW (ref 12.0–15.0)
MCH: 23.7 pg — ABNORMAL LOW (ref 26.0–34.0)
MCHC: 31 g/dL (ref 30.0–36.0)
MCV: 76.3 fL — ABNORMAL LOW (ref 80.0–100.0)
Platelets: 151 10*3/uL (ref 150–400)
RBC: 4.82 MIL/uL (ref 3.87–5.11)
RDW: 25.1 % — ABNORMAL HIGH (ref 11.5–15.5)
WBC: 10.1 10*3/uL (ref 4.0–10.5)
nRBC: 0 % (ref 0.0–0.2)

## 2020-06-27 LAB — ABO/RH: ABO/RH(D): A POS

## 2020-06-27 LAB — RPR: RPR Ser Ql: NONREACTIVE

## 2020-06-27 MED ORDER — MISOPROSTOL 50MCG HALF TABLET: 50.0000 ug | ORAL_TABLET | ORAL | Status: DC | PRN

## 2020-06-27 MED ORDER — EPHEDRINE 5 MG/ML INJ: 10.0000 mg | INTRAVENOUS | Status: DC | PRN

## 2020-06-27 MED ORDER — FENTANYL CITRATE (PF) 100 MCG/2ML IJ SOLN
100.0000 ug | INTRAMUSCULAR | Status: DC | PRN
  Administered 2020-06-27 (×3): 100 ug via INTRAVENOUS
  Filled 2020-06-27 (×3): qty 2

## 2020-06-27 MED ORDER — PHENYLEPHRINE 40 MCG/ML (10ML) SYRINGE FOR IV PUSH (FOR BLOOD PRESSURE SUPPORT): 80.0000 ug | PREFILLED_SYRINGE | INTRAVENOUS | Status: DC | PRN

## 2020-06-27 MED ORDER — BUTORPHANOL TARTRATE 1 MG/ML IJ SOLN
2.0000 mg | Freq: Once | INTRAMUSCULAR | Status: AC
  Administered 2020-06-27: 2 mg via INTRAVENOUS
  Filled 2020-06-27: qty 2

## 2020-06-27 MED ORDER — MISOPROSTOL 50MCG HALF TABLET
ORAL_TABLET | ORAL | Status: AC
  Administered 2020-06-27: 50 ug via BUCCAL
  Filled 2020-06-27: qty 1

## 2020-06-27 MED ORDER — PROMETHAZINE HCL 25 MG/ML IJ SOLN
12.5000 mg | Freq: Four times a day (QID) | INTRAMUSCULAR | Status: DC | PRN
  Administered 2020-06-27: 12.5 mg via INTRAVENOUS
  Filled 2020-06-27: qty 1

## 2020-06-27 MED ORDER — FENTANYL-BUPIVACAINE-NACL 0.5-0.125-0.9 MG/250ML-% EP SOLN
EPIDURAL | Status: AC
  Filled 2020-06-27: qty 250

## 2020-06-27 MED ORDER — DIPHENHYDRAMINE HCL 50 MG/ML IJ SOLN
12.5000 mg | INTRAMUSCULAR | Status: DC | PRN
  Filled 2020-06-27: qty 1

## 2020-06-27 MED ORDER — LACTATED RINGERS IV SOLN: 500.0000 mL | Freq: Once | INTRAVENOUS | Status: DC

## 2020-06-27 MED ORDER — FENTANYL-BUPIVACAINE-NACL 0.5-0.125-0.9 MG/250ML-% EP SOLN: 12.0000 mL/h | EPIDURAL | Status: DC | PRN

## 2020-06-27 NOTE — Progress Notes (Signed)
Patient ID: Eileen Melton, female   DOB: [birth year], 24 y.o.   MRN: [id number]  In bed using nitrous currently; has been up on ball; states that ctx still feel the same as earlier but at times a little closer; requesting something to help her rest  BP 131/66, 129/67 FHR 120s, +accels, no decels, occ variables Ctx irreg 3-7 mins Cx deferred  IUP@term  Latent labor GBS neg  Will try Stadol/Phenergan for rest  Arabella Merles CNM 06/27/2020 5:10 AM

## 2020-06-27 NOTE — Progress Notes (Addendum)
Eileen Melton is a 24 y.o. G2P0010 at [redacted]w[redacted]d SOL  Subjective: Pt fatigued; open to minimal intervention options  Objective: BP 122/64   Pulse 86   Temp 99.5 F (37.5 C) (Oral)   Resp 15   Ht 5' (1.524 m)   Wt 82.8 kg   LMP 09/07/2019 (Approximate)   BMI 35.66 kg/m  No intake/output data recorded. No intake/output data recorded.  FHT:  FHR: 120 bpm, variability: moderate,  accelerations:  Present,  decelerations:  Present variables UC:   irregular, every 3-6 minutes SVE:   Dilation: 3.5 Effacement (%): 80 Station: -1 Exam by:: Eileen Melton, CNM  Labs: Lab Results  Component Value Date   WBC 10.1 06/27/2020   HGB 11.4 (L) 06/27/2020   HCT 36.8 06/27/2020   MCV 76.3 (L) 06/27/2020   PLT 151 06/27/2020    Assessment / Plan: Discussed options w/ pt to help labor progress. Pt agreeable to cytotec. Plan: buccally. Pt understands that pitocin is  likely next step this evening.  Labor:  early labor Preeclampsia:   NA Fetal Wellbeing:  Category II Pain Control:  IV pain meds I/D:   GBS- Anticipated MOD:  NSVD  Eileen Melton 06/27/2020, 6:24 PM  Attestation of Supervision of Student:  I confirm that I have verified the information documented in the nurse midwife student's note and that I have also personally reperformed the history, physical exam and all medical decision making activities.  I have verified that all services and findings are accurately documented in this student's note; and I agree with management and plan as outlined in the documentation. I have also made any necessary editorial changes.  -Patient has been ambulating and attempting to use breast pump to stimulate contractions; cervix still 3-4 cm, 70/-1. She expressed desire today to avoid interventions. At this point she still wishes to try for non-epiduralized childbirth and waterbirth. However, cervix has remained unchanged and contractions are irregular and palpate mild.  Risk and benefits of  waiting to start pharmacologic methods discussed.  Patient agrees to try a dose of cytotec; does not want pitocin or AROM yet.  Discussed with patient that pitocin would be an anticipated recommendation, however, we support her patient autonomy and preferences and we will continue to partner with her in her care.   Eileen Melton, CNM [facility], [facility] 06/27/2020 6:38 PM  Patient Eileen Melton is a 24 y.o.

## 2020-06-27 NOTE — Progress Notes (Addendum)
Eileen Melton is a 24 y.o. G2P0010 at 89w0dadmitted for spontaneous labor  Subjective: Pt laboring well; continues to desire unmedicated, unepiduralized water birth  Objective: BP 135/66   Pulse 77   Temp 98.7 F (37.1 C) (Oral)   Resp 20   Ht 5' (1.524 m)   Wt 82.8 kg   LMP 09/07/2019 (Approximate)   BMI 35.66 kg/m  No intake/output data recorded. No intake/output data recorded.  FHT:  FHR: 120 bpm, variability: moderate,  accelerations:  Present,  decelerations:  Present variables UC:   irregular, every 4-7 minutes SVE:   Dilation: 4 Effacement (%): 80 Station: -1 Exam by:: Eileen Melton, SNM  Labs: Lab Results  Component Value Date   WBC 10.1 06/27/2020   HGB 11.4 (L) 06/27/2020   HCT 36.8 06/27/2020   MCV 76.3 (L) 06/27/2020   PLT 151 06/27/2020    Assessment / Plan: Discussed labor options w/ pt, including use of miso, pit, & AROM. Pt continues to desire natural labor & birth; chooses to continue breast pump and walking @ this time.  Labor:  protracted early/latent phase Preeclampsia:   NA Fetal Wellbeing:  Category II Pain Control:  Labor support without medications I/D:   GBS- Anticipated MOD:  NSVD  Eileen Melton 06/27/2020, 4:09 PM  Attestation of Supervision of Student:  I confirm that I have verified the information documented in the nurse midwife student's note and that I have also personally reperformed the history, physical exam and all medical decision making activities.  I have verified that all services and findings are accurately documented in this student's note; and I agree with management and plan as outlined in the documentation. I have also made any necessary editorial changes.    Eileen Melton, CNM [facility] for [employer], [facility] 06/27/2020 6:25 PM

## 2020-06-27 NOTE — Progress Notes (Signed)
   Eileen Melton is a 24 y.o. G2P0010 at [redacted]w[redacted]d  admitted for induction of labor due to Post dates. Due date 06/20/2020.  Subjective: Patient feeling some contractions, not strong. 4/10.   Objective: Vitals:   06/27/20 0722 06/27/20 0723 06/27/20 0739 06/27/20 0828  BP: (!) 92/47 (!) 93/59  126/63  Pulse: 64 75  72  Resp:    18  Temp:   (!) 97.5 F (36.4 C)   TempSrc:   Oral   Weight:      Height:       No intake/output data recorded.  FHT:  FHR: 120 bpm, variability: moderate,  accelerations:  Present,  decelerations:  Absent UC:   irregular, every q 3-4 minutes SVE:   Dilation: 3 Effacement (%): 80 Station: 0 Exam by:: J Mbugua RN  Labs: Lab Results  Component Value Date   WBC 10.1 06/27/2020   HGB 11.4 (L) 06/27/2020   HCT 36.8 06/27/2020   MCV 76.3 (L) 06/27/2020   PLT 151 06/27/2020    Assessment / Plan: Reviewed expectations for labor, will try nipple stim and walking. Understands that pitocin may be necessary.  -Patient still desires water birth, understands that she may need pitocin and while on pitocin she cannot have pitocin.  Labor: early labor Fetal Wellbeing:  Category I Pain Control:  Labor support without medications Anticipated MOD:  NSVD  Marylene Land 06/27/2020, 9:13 AM

## 2020-06-27 NOTE — H&P (Signed)
Eileen Melton is a 24 y.o. female G2P0010 at 41.0wks by 18wk scan presenting for latent labor. She began with ctx at 0300 7/10 and they have become increasingly stronger. Denies leaking or bldg; no H/A, N/V or visual disturbances. She was scheduled for a postdates IOL tonight as well. Her preg has been followed by the [facility] office and has been remarkable for:  # desires waterbirth (has had class/consent) # anxiety # isolated EICF w/ neg NIPS  OB History    Gravida  2   Para  0   Term  0   Preterm  0   AB  1   Living  0     SAB  0   TAB  1   Ectopic  0   Multiple  0   Live Births  0          Past Medical History:  Diagnosis Date   Anxiety    per patient   Past Surgical History:  Procedure Laterality Date   NO PAST SURGERIES     WISDOM TOOTH EXTRACTION     Family History: family history includes Aneurysm in her mother; Arthritis in her father; Cancer in her maternal grandmother; Hyperlipidemia in her father; Hypertension in her father. Social History:  reports that she quit smoking about 8 months ago. Her smoking use included cigars. She has never used smokeless tobacco. She reports previous alcohol use. She reports previous drug use. Frequency: 7.00 times per week. Drug: Marijuana.     Maternal Diabetes: No Genetic Screening: Normal Maternal Ultrasounds/Referrals: Isolated EIF (echogenic intracardiac focus) Fetal Ultrasounds or other Referrals:  Referred to Materal Fetal Medicine  Maternal Substance Abuse:  Yes:  Type: Marijuana (in 1st tri) Significant Maternal Medications:  None Significant Maternal Lab Results:  Group B Strep negative Other Comments:  None  Review of Systems History Dilation: 2 Effacement (%): 70 Station: -1 Exam by:: Jenn Mbuga, RN Blood pressure 129/67, pulse 77, temperature 98.4 F (36.9 C), temperature source Oral, resp. rate 16, height 5' (1.524 m), weight 82.8 kg, last menstrual period 09/07/2019, unknown if currently  breastfeeding. Exam Physical Exam Constitutional:      Appearance: Normal appearance.  HENT:     Head: Normocephalic.     Mouth/Throat:     Mouth: Mucous membranes are moist.  Cardiovascular:     Rate and Rhythm: Normal rate.  Pulmonary:     Effort: Pulmonary effort is normal.  Abdominal:     Comments: EFM 120s, +accels, no decels, occ variables Ctx irreg 2-6 mins  Musculoskeletal:        General: Normal range of motion.     Cervical back: Normal range of motion.  Skin:    General: Skin is warm and dry.  Neurological:     General: No focal deficit present.     Mental Status: She is alert and oriented to person, place, and time.  Psychiatric:        Mood and Affect: Mood normal.        Behavior: Behavior normal.        Thought Content: Thought content normal.     Prenatal labs: ABO, Rh: --/--/PENDING (07/10 2345) Antibody: PENDING (07/10 2345) Rubella: 13.80 (12/30 1048) RPR: Non Reactive (04/09 0920)  HBsAg: Negative (12/30 1048)  HIV: Non Reactive (04/09 0920)  GBS: Negative/-- (06/03 1429)   Assessment/Plan: IUP@41 .0wks Latent labor GBS neg  Admit to Labor and Delivery Expectant management Plans waterbirth (has had class/consent) Anticipate vag del  Arabella Merles CNM 06/27/2020, 12:47 AM

## 2020-06-27 NOTE — Progress Notes (Addendum)
Eileen Melton is a 24 y.o. G2P0010 at [redacted]w[redacted]d admitted for SOL  Subjective: Tired, breathing through contractions. Partner at bedside and supportive.  Objective: BP 134/75   Pulse 70   Temp 98.6 F (37 C) (Oral)   Resp 16   Ht 5' (1.524 m)   Wt 82.8 kg   LMP 09/07/2019 (Approximate)   BMI 35.66 kg/m  No intake/output data recorded.  FHT:  FHR: 115 bpm, variability: moderate,  accelerations:  Present,  decelerations:  Present occasional variables UC:   regular, every 2-3 minutes  SVE:   Dilation: 5.5 Effacement (%): 80 Station: -1 Exam by:: Osvaldo Human RN   Labs: Lab Results  Component Value Date   WBC 10.1 06/27/2020   HGB 11.4 (L) 06/27/2020   HCT 36.8 06/27/2020   MCV 76.3 (L) 06/27/2020   PLT 151 06/27/2020    Assessment / Plan: 24 y.o. G2P0010 at [redacted]w[redacted]d admitted for SOL  Labor: S/p Cyto x1. Just SROMed. Now 8/90/0. No longer desires water birth Fetal Wellbeing:  Category I Pain Control:  IV pain meds I/D:  GBS neg Anticipated MOD:  NSVD  Xayvier Vallez L Shaquitta Burbridge DO OB Fellow, Faculty Practice 06/27/2020, 10:54 PM

## 2020-06-28 ENCOUNTER — Encounter (HOSPITAL_COMMUNITY): Payer: Self-pay | Admitting: Obstetrics & Gynecology

## 2020-06-28 DIAGNOSIS — O4202 Full-term premature rupture of membranes, onset of labor within 24 hours of rupture: Secondary | ICD-10-CM

## 2020-06-28 DIAGNOSIS — Z3A41 41 weeks gestation of pregnancy: Secondary | ICD-10-CM

## 2020-06-28 DIAGNOSIS — O480 Post-term pregnancy: Secondary | ICD-10-CM

## 2020-06-28 MED ORDER — DIPHENHYDRAMINE HCL 25 MG PO CAPS: 25.0000 mg | ORAL_CAPSULE | Freq: Four times a day (QID) | ORAL | Status: DC | PRN

## 2020-06-28 MED ORDER — COCONUT OIL OIL: 1.0000 "application " | TOPICAL_OIL | Status: DC | PRN

## 2020-06-28 MED ORDER — DIPHENHYDRAMINE HCL 50 MG/ML IJ SOLN
25.0000 mg | Freq: Once | INTRAMUSCULAR | Status: AC
  Administered 2020-06-28: 25 mg via INTRAVENOUS

## 2020-06-28 MED ORDER — SIMETHICONE 80 MG PO CHEW: 80.0000 mg | CHEWABLE_TABLET | ORAL | Status: DC | PRN

## 2020-06-28 MED ORDER — TETANUS-DIPHTH-ACELL PERTUSSIS 5-2.5-18.5 LF-MCG/0.5 IM SUSP: 0.5000 mL | Freq: Once | INTRAMUSCULAR | Status: DC

## 2020-06-28 MED ORDER — ONDANSETRON HCL 4 MG/2ML IJ SOLN: 4.0000 mg | INTRAMUSCULAR | Status: DC | PRN

## 2020-06-28 MED ORDER — SENNOSIDES-DOCUSATE SODIUM 8.6-50 MG PO TABS
2.0000 | ORAL_TABLET | ORAL | Status: DC
  Administered 2020-06-28 – 2020-06-29 (×2): 2 via ORAL
  Filled 2020-06-28 (×2): qty 2

## 2020-06-28 MED ORDER — ONDANSETRON HCL 4 MG PO TABS: 4.0000 mg | ORAL_TABLET | ORAL | Status: DC | PRN

## 2020-06-28 MED ORDER — PRENATAL MULTIVITAMIN CH
1.0000 | ORAL_TABLET | Freq: Every day | ORAL | Status: DC
  Administered 2020-06-28 – 2020-06-29 (×2): 1 via ORAL
  Filled 2020-06-28 (×2): qty 1

## 2020-06-28 MED ORDER — WITCH HAZEL-GLYCERIN EX PADS: 1.0000 "application " | MEDICATED_PAD | CUTANEOUS | Status: DC | PRN

## 2020-06-28 MED ORDER — BENZOCAINE-MENTHOL 20-0.5 % EX AERO
1.0000 "application " | INHALATION_SPRAY | CUTANEOUS | Status: DC | PRN
  Filled 2020-06-28: qty 56

## 2020-06-28 MED ORDER — DIBUCAINE (PERIANAL) 1 % EX OINT: 1.0000 "application " | TOPICAL_OINTMENT | CUTANEOUS | Status: DC | PRN

## 2020-06-28 MED ORDER — TERBUTALINE SULFATE 1 MG/ML IJ SOLN: 0.2500 mg | Freq: Once | INTRAMUSCULAR | Status: DC | PRN

## 2020-06-28 MED ORDER — IBUPROFEN 600 MG PO TABS
600.0000 mg | ORAL_TABLET | Freq: Four times a day (QID) | ORAL | Status: DC
  Administered 2020-06-28 – 2020-06-30 (×8): 600 mg via ORAL
  Filled 2020-06-28 (×8): qty 1

## 2020-06-28 MED ORDER — ACETAMINOPHEN 325 MG PO TABS
650.0000 mg | ORAL_TABLET | ORAL | Status: DC | PRN
  Administered 2020-06-29: 650 mg via ORAL
  Filled 2020-06-28: qty 2

## 2020-06-28 MED ORDER — ZOLPIDEM TARTRATE 5 MG PO TABS: 5.0000 mg | ORAL_TABLET | Freq: Every evening | ORAL | Status: DC | PRN

## 2020-06-28 MED ORDER — OXYTOCIN-SODIUM CHLORIDE 30-0.9 UT/500ML-% IV SOLN
1.0000 m[IU]/min | INTRAVENOUS | Status: DC
  Administered 2020-06-28: 2 m[IU]/min via INTRAVENOUS

## 2020-06-28 NOTE — Discharge Instructions (Signed)

## 2020-06-28 NOTE — Progress Notes (Signed)
Good progress with pushing on hands and knees FHR difficult to trace FSE placed at this time  Marlowe Alt, DO OB Fellow, Faculty Practice 06/28/2020 4:43 AM

## 2020-06-28 NOTE — Progress Notes (Signed)
FHR: 125 with mod var, accels and + scalp stim, early decels CTX: q2-3 min, regular SVE: 10/100/+1  Will start pushing, anticipate SVD  Yona Stansbury, Margarette Asal, DO OB Fellow, Faculty Practice 06/28/2020 3:59 AM

## 2020-06-28 NOTE — Lactation Note (Signed)
This note was copied from a baby's chart. Lactation Consultation Note  Patient Name: Eileen Melton FYBOF'B Date: 06/28/2020 Reason for consult: Initial assessment   P1, Baby 5 hours old and sleeping. Reviewed with FOB how to change diaper and use bulb syringe. Baby spitty. Reviewed hand expression and attempted to latch on both breasts after unwrapping infant. Baby sleepy.  Suggest mother nap and when she wakes place baby STS on her chest until he feeds again. Mother states has has latched x 1 and she felt he had a good feeding. Feed on demand with cues.  Goal 8-12+ times per day after first 24 hrs.  Place baby STS if not cueing.  Mom made aware of O/P services, breastfeeding support groups, community resources, and our phone # for post-discharge questions.     Maternal Data Has patient been taught Hand Expression?: Yes Does the patient have breastfeeding experience prior to this delivery?: No  Feeding Feeding Type: Breast Fed  LATCH Score Latch: Repeated attempts needed to sustain latch, nipple held in mouth throughout feeding, stimulation needed to elicit sucking reflex.  Audible Swallowing: A few with stimulation  Type of Nipple: Everted at rest and after stimulation  Comfort (Breast/Nipple): Soft / non-tender  Hold (Positioning): Assistance needed to correctly position infant at breast and maintain latch.  LATCH Score: 7  Interventions Interventions: Breast feeding basics reviewed;Assisted with latch;Hand express  Lactation Tools Discussed/Used     Consult Status Consult Status: Follow-up Date: 06/29/20 Follow-up type: In-patient    [name] [facility] 06/28/2020, 10:19 AM

## 2020-06-28 NOTE — Progress Notes (Signed)
Eileen Melton is a 24 y.o. G2P0010 at [redacted]w[redacted]d admitted for SOL  Subjective: Coping better with NitrOx  Objective: BP (!) 122/96   Pulse 95   Temp 98.9 F (37.2 C) (Oral)   Resp 17   Ht 5' (1.524 m)   Wt 82.8 kg   LMP 09/07/2019 (Approximate)   BMI 35.66 kg/m  No intake/output data recorded.  FHT:  FHR: 120 bpm, variability: moderate,  accelerations:  Present,  decelerations:  Absent UC:   regular, every 2-3 minutes  SVE:   Dilation: 9 Effacement (%): 80 Station: 0 Exam by:: Dr Salomon Mast  Labs: Lab Results  Component Value Date   WBC 10.1 06/27/2020   HGB 11.4 (L) 06/27/2020   HCT 36.8 06/27/2020   MCV 76.3 (L) 06/27/2020   PLT 151 06/27/2020    Assessment / Plan: 24 y.o. G2P0010 at [redacted]w[redacted]d admitted for SOL  Labor: S/p Cyto x1, SROM. She has been nine ~3 hours. Recommended starting pitocin, patient agreeable.  Fetal Wellbeing:  Category I Pain Control:  IV pain meds I/D:  GBS neg Anticipated MOD:  NSVD  Eileen Melton L Eileen Lavoy DO OB Fellow, Faculty Practice 06/28/2020, 3:05 AM

## 2020-06-28 NOTE — Discharge Summary (Signed)
Postpartum Discharge Summary      Patient Name: Eileen Melton DOB: September 22, 1996 MRN: [id number]  Date of admission: 06/26/2020 Delivery date:06/28/2020  Delivering provider: Merilyn Baba  Date of discharge: 06/30/2020  Admitting diagnosis: Post-dates pregnancy [O48.0] Intrauterine pregnancy: [redacted]w[redacted]d    Secondary diagnosis:  Active Problems:   Anxiety   Echogenic intracardiac focus of fetus on prenatal ultrasound   Post-dates pregnancy  Additional problems: None    Discharge diagnosis: Term Pregnancy Delivered                                              Post partum procedures: None Augmentation: Pitocin Complications: None  Hospital course: Onset of Labor With Vaginal Delivery      24y.o. yo G2P0010 at 414w1das admitted in Latent Labor on 06/26/2020. Patient had an uncomplicated labor course as follows:   Admitted in latent labor. She received one dose of cytotec for augmentation. She SROMed and progressed to 9 cm, then required pitocin for augmentation. She progressed to complete, and delivered after a normal second stage.  Membrane Rupture Time/Date: 10:40 PM ,06/27/2020   Delivery Method:Vaginal, Spontaneous  Episiotomy:   Lacerations:  2nd degree  Patient had an uncomplicated postpartum course.  She is ambulating, tolerating a regular diet, passing flatus, and urinating well. Patient is discharged home in stable condition on 06/30/20.  Newborn Data: Birth date:06/28/2020  Birth time:5:11 AM  Gender:Female  Living status:Living  Apgars:8 ,9  Weight:2829 g   Magnesium Sulfate received: No BMZ received: No Rhophylac:N/A MMR:N/A T-DaP:Given prenatally Flu: N/A Transfusion:No  Physical exam  Vitals:   06/28/20 2113 06/29/20 0523 06/29/20 1434 06/29/20 2300  BP: (!) 115/57 114/67 115/68 115/79  Pulse: 72 77 80 84  Resp: _0 Temp: 98.1 F (36.7 C) 98.6 F (37 C) 98 F (36.7 C) 98.2 F (36.8 C)  TempSrc: Oral Oral Oral Oral  SpO2: 99% 100%   100%  Weight:      Height:       General: alert, cooperative and no distress Lochia: appropriate Uterine Fundus: firm Incision: N/A DVT Evaluation: No evidence of DVT seen on physical exam. Labs: Lab Results  Component Value Date   WBC 11.3 (H) 06/29/2020   HGB 9.7 (L) 06/29/2020   HCT 32.4 (L) 06/29/2020   MCV 78.8 (L) 06/29/2020   PLT 137 (L) 06/29/2020   No flowsheet data found. Edinburgh Score: Edinburgh Postnatal Depression Scale Screening Tool 06/29/2020  I have been able to laugh and see the funny side of things. 0  I have looked forward with enjoyment to things. 0  I have blamed myself unnecessarily when things went wrong. 2  I have been anxious or worried for no good reason. 2  I have felt scared or panicky for no good reason. 2  Things have been getting on top of me. 1  I have been so unhappy that I have had difficulty sleeping. 1  I have felt sad or miserable. 0  I have been so unhappy that I have been crying. 0  The thought of harming myself has occurred to me. 0  Edinburgh Postnatal Depression Scale Total 8     After visit meds:  Allergies as of 06/30/2020      Reactions   Hydrocodone-acetaminophen Itching   Kiwi Extract  Medication List    TAKE these medications   acetaminophen 325 MG tablet Commonly known as: Tylenol Take 2 tablets (650 mg total) by mouth every 4 (four) hours as needed (for pain scale < 4).   clotrimazole 1 % cream Commonly known as: LOTRIMIN Apply to affected area 2 times daily   ferrous sulfate 325 (65 FE) MG EC tablet Take 1 tablet (325 mg total) by mouth 2 (two) times daily with a meal.   ibuprofen 600 MG tablet Commonly known as: ADVIL Take 1 tablet (600 mg total) by mouth every 6 (six) hours.   multivitamin-prenatal 27-0.8 MG Tabs tablet Take 1 tablet by mouth daily at 12 noon.        Discharge home in stable condition Infant Feeding: Breast Infant Disposition:home with mother Discharge instruction: per  After Visit Summary and Postpartum booklet. Activity: Advance as tolerated. Pelvic rest for 6 weeks.  Diet: routine diet Future Appointments: Future Appointments  Date Time Provider Department Center  07/13/2020 10:30 AM Gray, Cheryl F, PT [facility]  07/20/2020 10:30 AM Gray, Cheryl F, PT [facility]  07/27/2020  3:15 PM Goswick, Anna E, MD [facility]  08/03/2020 10:30 AM Gray, Cheryl F, PT [facility]  08/10/2020 10:30 AM Gray, Cheryl F, PT [facility]   Follow up Visit:   Please schedule this patient for a In person postpartum visit in 4 weeks with the following provider: Any provider. Additional Postpartum F/U: None  Low risk pregnancy complicated by: post dates Delivery mode:  Vaginal, Spontaneous  Anticipated Birth Control:  declines   06/30/2020 Hailey L Sparacino, DO   

## 2020-06-28 NOTE — Lactation Note (Signed)
This note was copied from a baby's chart. Lactation Consultation Note  Patient Name: Eileen Melton Date: 06/28/2020 Reason for consult: Follow-up assessment;Difficult latch  Follow up with 18 hours old baby Eileen with latch difficulty. Mother reports last feeding was better and she was able to identify suckling and swallowing. Parents state their main goal is to exclusively breastfeed this baby but they are using DM to supplement at the moment. Mother stated she plans to pump after this feeding. Reviewed reasons for pumping as well as milk storage guidelines.  Discussed supplementation guidelines, stomach size and paced bottle feeding. Encouraged frequent burping and sit up position. Encouraged to contact lactation services as needed for any questions or concerns.    Feeding Feeding Type: Breast Milk with Donor Milk Nipple Type: Slow - flow  LATCH Score Latch: Repeated attempts needed to sustain latch, nipple held in mouth throughout feeding, stimulation needed to elicit sucking reflex.  Audible Swallowing: A few with stimulation  Type of Nipple: Flat  Comfort (Breast/Nipple): Soft / non-tender  Hold (Positioning): Assistance needed to correctly position infant at breast and maintain latch.  LATCH Score: 6  Interventions Interventions: Breast feeding basics reviewed;Skin to skin;Position options;Adjust position;DEBP  Lactation Tools Discussed/Used Pump Review: Setup, frequency, and cleaning Initiated by:: Ramond Craver, RN Date initiated:: 06/28/20   Consult Status Consult Status: Follow-up Date: 06/29/20 Follow-up type: In-patient    Audriella Blakeley A [name] 06/28/2020, 11:41 PM

## 2020-06-29 LAB — CBC
HCT: 32.4 % — ABNORMAL LOW (ref 36.0–46.0)
Hemoglobin: 9.7 g/dL — ABNORMAL LOW (ref 12.0–15.0)
MCH: 23.6 pg — ABNORMAL LOW (ref 26.0–34.0)
MCHC: 29.9 g/dL — ABNORMAL LOW (ref 30.0–36.0)
MCV: 78.8 fL — ABNORMAL LOW (ref 80.0–100.0)
Platelets: 137 10*3/uL — ABNORMAL LOW (ref 150–400)
RBC: 4.11 MIL/uL (ref 3.87–5.11)
RDW: 25.8 % — ABNORMAL HIGH (ref 11.5–15.5)
WBC: 11.3 10*3/uL — ABNORMAL HIGH (ref 4.0–10.5)
nRBC: 0 % (ref 0.0–0.2)

## 2020-06-29 MED ORDER — FERROUS SULFATE 325 (65 FE) MG PO TABS
325.0000 mg | ORAL_TABLET | ORAL | Status: DC
  Administered 2020-06-29: 325 mg via ORAL
  Filled 2020-06-29: qty 1

## 2020-06-29 NOTE — Lactation Note (Signed)
This note was copied from a baby's chart. Lactation Consultation Note  Patient Name: Eileen Melton [id number] Date: 06/29/2020 Reason for consult: Follow-up assessment;Mother's request;Difficult latch   Follow up per mother's request. Mother stated she is ready to latch baby. Mother reported she had been pumping and explained pump was set up for stimulation purposes since DM is being used to feed baby. Review education regarding frequency, cleaning and milk storage. Encouraged to pump every time she uses DM and feed baby everything she pumps. Mother explained her biggest challenge breastfeeding is baby's shallow latch. Several attempts to latch baby to right breast on football hold. Baby slid off breast after a few sucks.   Discussed the possibility of using a nipple shield and uses expressed BM inside nipple shield to entice baby. Mother is willing to try at this time. Showed mother how to use a 20 mm NS and used 2.58mL of BM inside NS. Latched baby on right breast on football hold. Baby latched easily and sustained latch with rhythm and noted swallowing. Noted baby curls upper lip when sucking. Corrected upper lip curling and assisted mother with positioning to attain a deeper latch.  Identified some swallows, baby was able to take 2.5mL extra of BM. Baby stayed onto right breast for 20 minutes. Mother burped baby and transition to left breast on cradle hold using NS. Baby continued with a good deep latch and noted swallowing again. Baby was still on breast by the time LC left room.   Discussed with parents using 41mL of DM with bottle additionally and paced bottle feeding guidelines were reviewed at that point.   Encourage to follow hunger cues to feed baby and fullness signs when supplementing to prevent overfeeding. Reviewed breastfeeding basics. Discussed milk coming to volume. Reviewed NBN behavior and second day expectations with parents and encouraged to contact [facility] for support as needed and  recommended to request help for questions or concerns.    All questions answered at this time.   Maternal Data Formula Feeding for Exclusion: No Has patient been taught Hand Expression?: Yes  Feeding Feeding Type: Breast Milk with Donor Milk  LATCH Score Latch: Repeated attempts needed to sustain latch, nipple held in mouth throughout feeding, stimulation needed to elicit sucking reflex.  Audible Swallowing: A few with stimulation  Type of Nipple: Everted at rest and after stimulation  Comfort (Breast/Nipple): Soft / non-tender  Hold (Positioning): Assistance needed to correctly position infant at breast and maintain latch.  LATCH Score: 7  Interventions Interventions: Assisted with latch;Skin to skin;Hand express;Adjust position;Support pillows;Position options;Expressed milk;DEBP  Lactation Tools Discussed/Used Tools: Pump;Nipple Dorris Carnes;Bottle Nipple shield size: 20 Breast pump type: Double-Electric Breast Pump   Consult Status Consult Status: Follow-up Date: 06/30/20 Follow-up type: In-patient    [name] A [name] 06/29/2020, 10:46 PM

## 2020-06-29 NOTE — Progress Notes (Signed)
POSTPARTUM PROGRESS NOTE  Subjective: Eileen Melton is a 24 y.o. G2P1011 on PPD#1 s/p NSVD at [redacted]w[redacted]d.  She reports she doing well. No acute events overnight. She denies any problems with ambulating, voiding or po intake. Denies nausea or vomiting. She has passed flatus. Pain is well controlled.  Lochia is appropriate.  Objective: Blood pressure 114/67, pulse 77, temperature 98.6 F (37 C), temperature source Oral, resp. rate 16, height 5' (1.524 m), weight 82.8 kg, last menstrual period 09/07/2019, SpO2 100 %, unknown if currently breastfeeding.  Physical Exam:  General: alert, cooperative and no distress Chest: no respiratory distress Abdomen: soft, non-tender  Uterine Fundus: firm, appropriately tender Extremities: No calf swelling or tenderness  No edema  Recent Labs    06/27/20 0000 06/29/20 0608  HGB 11.4* 9.7*  HCT 36.8 32.4*    Assessment/Plan: Eileen Melton is a 24 y.o. G2P1011 on PPD#1 s/p NSVD at [redacted]w[redacted]d.  Routine Postpartum Care: Doing well, pain well-controlled.  -- Continue routine care, lactation support  -- Contraception: declines -- Feeding: breast -- Hgb 11.4>9.7: PO iron  Dispo: Plan for discharge tomorrow.  Marlowe Alt, DO OB/GYN Fellow, [facility] for [facility]

## 2020-06-29 NOTE — Clinical Social Work Maternal (Signed)
CLINICAL SOCIAL WORK MATERNAL/CHILD NOTE  Patient Details  Name: Boy Margel Joens MRN: [id number] Date of Birth: 06/28/2018  Date:  06/29/2020  Clinical Social Worker Initiating Note:  Elijio Miles Date/Time: Initiated:  06/29/20/1412     Child's Name:  Nicholes Calamity   Biological Parents:  Mother, Father Rudene Anda DOB: 03/11/1997)   Need for Interpreter:  None   Reason for Referral:  Behavioral Health Concerns, Current Substance Use/Substance Use During Pregnancy    Address:  [address] [place] [address]    Phone number:  [phone] (home)     Additional phone number:   Household Members/Support Persons (HM/SP):   Household Member/Support Person 1, Household Member/Support Person 2   HM/SP Name Relationship DOB or Age  HM/SP -[place]    HM/SP -2   Dad    HM/SP -3        HM/SP -4        HM/SP -5        HM/SP -6        HM/SP -7        HM/SP -8          Natural Supports (not living in the home):  Spouse/significant other, Extended Family   Professional Supports: None   Employment: Unemployed   Type of Work:     Education:  Programmer, systems   Homebound arranged:    Museum/gallery curator Resources:  [employer]   Other Resources:  Physicist, medical    Cultural/Religious Considerations Which May Impact Care:    Strengths:  Ability to meet basic needs , Home prepared for child , Pediatrician chosen   Psychotropic Medications:         Pediatrician:    [place]  Pediatrician List:   Lavella Hammock, [place]      Pediatrician Fax Number:    Risk Factors/Current Problems:  Mental Health Concerns , Substance Use    Cognitive State:  Able to Concentrate , Alert , Linear Thinking    Mood/Affect:  Calm , Comfortable , Happy , Interested    CSW Assessment:  CSW received consult for history of anxiety and depression and  THC use during pregnancy.  CSW met with MOB to offer support and complete assessment.    MOB resting in bed holding infant with FOB present at bedside, when CSW entered the room. CSW introduced self and received verbal permission from MOB to complete assessment with FOB present. MOB and FOB both very pleasant and engaged throughout assessment. CSW explained reason for consult to which both expressed understanding. MOB stated she currently lives with her grandmother and dad. MOB confirmed she receives food stamps and intends to inform them of her delivery. CSW inquired about MOB's mental health history to which MOB acknowledged anxiety starting at the age of 76 and depression at the age of 11. MOB noted anxiety and depression 6 months prior to finding out she was pregnant as well as an increase in anxiety during the first trimester of her pregnancy. Per MOB, symptoms improved after that. MOB did share some feelings of anxiety regarding breastfeeding but stated lactation consultants are helping and overall she feels good. CSW provided education regarding the baby blues period vs. perinatal mood disorders, discussed treatment and gave resources for mental health follow up if concerns arise. CSW recommended self-evaluation during the postpartum time period  using the New Mom Checklist from Postpartum Progress and encouraged MOB to contact a medical professional if symptoms are noted at any time. MOB denied any current SI or HI and reported having good support from FOB, her sister and FOB's mom.   CSW inquired about MOB's substance use during pregnancy. MOB acknowledged using marijuana prior to finding out she was pregnant and denied any use after. CSW informed MOB of Hospital Drug Policy and explained UDS and CDS were still pending but that a CPS report would be made, if warranted. MOB denied any questions or concerns regarding policy.  MOB confirmed having all essential items for infant once discharged and stated  infant would be sleeping in a crib once home. CSW provided review of Sudden Infant Death Syndrome (SIDS) precautions and safe sleeping habits.     CSW Plan/Description:  No Further Intervention Required/No Barriers to Discharge, Sudden Infant Death Syndrome (SIDS) Education, Perinatal Mood and Anxiety Disorder (PMADs) Education, [place], CSW Will Continue to Monitor Umbilical Cord Tissue Drug Screen Results and Make Report if Foye Spurling, LCSW 06/29/2020, 12:34 PM

## 2020-06-29 NOTE — Lactation Note (Signed)
This note was copied from a baby's chart. Lactation Consultation Note  Patient Name: Boy Malicia Blasdel [id number] Date: 06/29/2020 Reason for consult: Follow-up assessment;Difficult latch;Term  Follow-up assessment of 63 hours old baby boy. Mother states baby has had 25 mL of donor milk and she is ready to try latching baby. Encouraged mother to contact LC to observe feeding. Mother verbalized agreement. Mother reports baby had a good size stool. Mother shares she is more confident with plan in place and baby seems to be doing well.   Parents are aware to call lactation as needed.     Maternal Data Formula Feeding for Exclusion: No Has patient been taught Hand Expression?: Yes  Feeding Feeding Type: Donor Breast Milk Nipple Type: Nfant Slow Flow (purple)  Consult Status Consult Status: Follow-up Date: 06/30/20 Follow-up type: In-patient    Damontae Loppnow A [name] 06/29/2020, 8:31 PM

## 2020-06-30 MED ORDER — IBUPROFEN 600 MG PO TABS: 600.0000 mg | ORAL_TABLET | Freq: Four times a day (QID) | ORAL | 0 refills | Status: DC

## 2020-06-30 MED ORDER — ACETAMINOPHEN 325 MG PO TABS: 650.0000 mg | ORAL_TABLET | ORAL | 0 refills | Status: DC | PRN

## 2020-06-30 NOTE — Lactation Note (Signed)
This note was copied from a baby's chart. Lactation Consultation Note  Patient Name: Eileen Melton Date: 06/30/2020 Reason for consult: Follow-up assessment   Mom ready for DC.  Mom fed last night without NS and thought infant fed well.  Concerned about infant's tongue to roof of mouth.  LC demonstrated suck training prior to latching while doing oral assessment on infant.    Infant recently fed 1.5 hours ago, donor milk with bottle.    Moms breast are full and heavier since yesterday.  Mom demonstrated hand expression and drops seen easily. She has more difficulty with left side.  Infant placed in football to latch.  Infant latched approx. 8 minutes sucking sporadically with only a couple of swallows heard.   Mom had wrist pain.  Infant changed to cross cradle and infant latched deeply.  Family shown how to assist with pillow and rolled blanket support.  Infant fed 20 minutes, (still feeding when LC left room),  with good audible swallows, deep rhythmic jaw movement and cheeks flush to breast.  Mom denied discomfort.    During feeding, LC discussed engorgement prevention and supply and demand.  Plan is to attempt without NS hand expressing prior to latching.  If infant receives a supplement with EBM or formula, mom needs to pump to support milk supply.    Mom is aware of support groups and OP services.      Maternal Data Has patient been taught Hand Expression?: Yes  Feeding Feeding Type: Breast Fed Nipple Type: Extra Slow Flow  LATCH Score Latch: Repeated attempts needed to sustain latch, nipple held in mouth throughout feeding, stimulation needed to elicit sucking reflex.  Audible Swallowing: A few with stimulation  Type of Nipple: Everted at rest and after stimulation (breast full/short shaft nipple)  Comfort (Breast/Nipple): Filling, red/small blisters or bruises, mild/mod discomfort  Hold (Positioning): Assistance needed to correctly position infant at breast and  maintain latch.  LATCH Score: 6  Interventions Interventions: Breast feeding basics reviewed;Assisted with latch;Skin to skin;Breast massage;Hand express;Position options;Support pillows;Adjust position;Breast compression  Lactation Tools Discussed/Used     Consult Status Consult Status: Complete Date: 06/30/20 Follow-up type: In-patient    Eileen Melton [facility] 06/30/2020, 8:44 AM

## 2020-07-13 ENCOUNTER — Other Ambulatory Visit: Payer: Self-pay

## 2020-07-13 ENCOUNTER — Encounter: Payer: Medicaid Other | Attending: Obstetrics and Gynecology | Admitting: Physical Therapy

## 2020-07-13 ENCOUNTER — Encounter: Payer: Self-pay | Admitting: Physical Therapy

## 2020-07-13 DIAGNOSIS — M6281 Muscle weakness (generalized): Secondary | ICD-10-CM | POA: Insufficient documentation

## 2020-07-13 DIAGNOSIS — R279 Unspecified lack of coordination: Secondary | ICD-10-CM | POA: Diagnosis present

## 2020-07-13 NOTE — Therapy (Signed)
[facility] at [facility] [address] [place], [place], [address] Phone: [phone]   Fax:  [phone]  Physical Therapy Evaluation  Patient Details  Name: Eileen Melton MRN: [id number] Date of Birth: 04/24/1996 Referring Provider (PT): Dr. Venia Carbon   Encounter Date: 07/13/2020   PT End of Session - 07/13/20 1114    Visit Number 1    Date for PT Re-Evaluation 10/05/20    Authorization Type [place] Medicaid [employer]    PT Start Time 1030    PT Stop Time 1115    PT Time Calculation (min) 45 min    Activity Tolerance Patient tolerated treatment well;No increased pain    Behavior During Therapy WFL for tasks assessed/performed           Past Medical History:  Diagnosis Date  . Anxiety    per patient    Past Surgical History:  Procedure Laterality Date  . NO PAST SURGERIES    . WISDOM TOOTH EXTRACTION      There were no vitals filed for this visit.    Subjective Assessment - 07/13/20 1035    Subjective Had child 2 weeks late. I am only having pain with I have my stitches and feel pressure when I stand up. NO urinary leakage. Had a vaginal birth 06/28/2020. Patient had a 2nd dgree tear.    Patient Stated Goals learn how to exercise the pelvic floor and how to get body back since giving birth    Currently in Pain? Yes    Pain Score 3     Pain Location Vagina    Pain Orientation Mid    Pain Descriptors / Indicators Discomfort;Pressure    Pain Type Acute pain    Pain Onset More than a month ago    Pain Frequency Intermittent    Aggravating Factors  When stand up too long, grocery shopping    Pain Relieving Factors sit to either side    Multiple Pain Sites No              [facility] PT Assessment - 07/13/20 0001      Assessment   Medical Diagnosis S33.4XXA Dislocation of symphysis pubis, initial encounter    Referring Provider (PT) Dr. Victorino Dike Rasch    Onset Date/Surgical Date 06/28/20    Prior Therapy None        Precautions   Precautions None      Restrictions   Weight Bearing Restrictions No      Balance Screen   Has the patient fallen in the past 6 months No    Has the patient had a decrease in activity level because of a fear of falling?  No    Is the patient reluctant to leave their home because of a fear of falling?  No      Home Tourist information centre manager residence      Prior Function   Level of Independence Independent    Vocation Requirements Advertising account planner    Leisure exercised prior to giving birth      Cognition   Overall Cognitive Status Within Functional Limits for tasks assessed      Posture/Postural Control   Posture/Postural Control Postural limitations    Postural Limitations Rounded Shoulders;Forward head      ROM / Strength   AROM / PROM / Strength AROM;PROM;Strength      AROM   Lumbar - Right Side Bend decreased by 25%    Lumbar -  Left Side Bend decreased by 25%    Lumbar - Right Rotation decreased by 25%      Strength   Right Hip External Rotation  4/5    Right Hip Internal Rotation 4/5    Right Hip ABduction 4/5    Right Hip ADduction 4/5    Left Hip External Rotation 4/5    Left Hip Internal Rotation 4/5    Left Hip ABduction 5/5    Left Hip ADduction 5/5      Palpation   Spinal mobility Decreased movement of T4-L5    Palpation comment decreased mobility of th elower rib cage                      Objective measurements completed on examination: See above findings.     Pelvic Floor Special Questions - 07/13/20 0001    Prior Pregnancies Yes    Number of Pregnancies 2    Number of Vaginal Deliveries 1    Diastasis Recti 2 finger above the umbilicus, 1 finger 2 inches above the umbilicus    Currently Sexually Active No   just had child on 06/28/2020   Urinary Leakage No    Fecal incontinence No   constipation           [facility] Adult PT Treatment/Exercise - 07/13/20 0001      Lumbar Exercises: Supine   Ab Set  15 reps;4 seconds    AB Set Limitations 90/90 breathing to work on rib cage expansion and pelvic floor contraction, yoga block between knees                  PT Education - 07/13/20 1113    Education Details 90/90 breathing; rib mobility in sidely    Person(s) Educated Patient    Methods Explanation;Demonstration;Verbal cues;Handout    Comprehension Returned demonstration;Verbalized understanding            PT Short Term Goals - 07/13/20 1122      PT SHORT TERM GOAL #1   Title independent with initial HEP    Baseline not educated yet    Period Weeks    Status New    Target Date 08/10/20             PT Long Term Goals - 07/13/20 1122      PT LONG TERM GOAL #1   Title dependent with advanced HEP    Baseline not educated yet    Time 12    Period Weeks    Status New    Target Date 10/05/20      PT LONG TERM GOAL #2   Title able to contract her abdominal sfor bracing to perform her daily tasks    Baseline diastasis is 2 fingers width apart    Time 12    Period Weeks    Status New    Target Date 10/05/20      PT LONG TERM GOAL #3   Title able to contract the pelvic floora nd improve endurance to reduce pressuer with shopping and standing to minimal to none    Baseline pressure with standing and shopping due to giving birth 2 weeks ago and has stitiches    Time 12    Period Weeks    Status New    Target Date 10/05/20      PT LONG TERM GOAL #4   Title able to take care of the baby with correct body mechanics and correct core stabilization  to reduce strain on the pelvic floor    Baseline not educated yet    Time 12    Period Weeks    Status New    Target Date 10/05/20                  Plan - 07/13/20 1116    Clinical Impression Statement Patient is a 24 year old female with pelvic weakness since giving birth on 06/28/2020 vaginally. She reports pressure and soreness when standing for long period of time at level 7/10. Patient wears a belly band  to give her support in her back when breast feeding due to discomfort. Patinet reports difficulty with constipation putting pressuer on the pelvic floor. Bilateral hip P/ROM is 50 degrees. Lumbar ROM is limited by 25% for right sidebending and right rotation. Decreased expansion of the lower rib cage. Decreased movement of T4-L5. Decreased strength in bilateral hip rotators and right hip abduction and adduction. diastasis recti 2 fingers width above the umbilicus and good tension. Patient reprots she had a second degree tear during birth and the stitches are still healing. Patient will benefit from skilled therapy to reduce the pain and imporve the strength.    Personal Factors and Comorbidities Comorbidity 1;Fitness    Comorbidities baginal birth with second degree tear on 06/28/2020    Examination-Activity Limitations Caring for Others;Stand;Lift    Examination-Participation Restrictions Shop    Stability/Clinical Decision Making Stable/Uncomplicated    Clinical Decision Making Low    Rehab Potential Excellent    PT Frequency 1x / week    PT Duration 12 weeks    PT Treatment/Interventions ADLs/Self Care Home Management;Electrical Stimulation;Moist Heat;Therapeutic exercise;Therapeutic activities;Neuromuscular re-education;Patient/family education;Manual techniques;Spinal Manipulations    PT Next Visit Plan abdominal massage; work with rib mobility, reverse clams, piriromis stretch, interscapular strength; dead bug    Consulted and Agree with Plan of Care Patient           Patient will benefit from skilled therapeutic intervention in order to improve the following deficits and impairments:  Decreased endurance, Pain, Decreased activity tolerance, Decreased range of motion, Increased muscle spasms, Decreased strength  Visit Diagnosis: Muscle weakness (generalized) - Plan: PT plan of care cert/re-cert  Unspecified lack of coordination - Plan: PT plan of care cert/re-cert     Problem  List Patient Active Problem List   Diagnosis Date Noted  . Post-dates pregnancy 06/26/2020  . Echogenic intracardiac focus of fetus on prenatal ultrasound 03/09/2020  . Supervision of low-risk pregnancy 12/10/2019  . Anxiety 12/10/2019  . Marijuana use 10/21/2018    Eulis Foster, PT 07/13/20 11:30 AM   [place] [facility] at [facility] [address] [place], [place], [address] Phone: [phone]   Fax:  [phone]  Name: Eileen Melton MRN: [id number] Date of Birth: 05/24/1996

## 2020-07-20 ENCOUNTER — Other Ambulatory Visit: Payer: Self-pay

## 2020-07-20 ENCOUNTER — Encounter: Payer: Medicaid Other | Attending: Obstetrics and Gynecology | Admitting: Physical Therapy

## 2020-07-20 ENCOUNTER — Encounter: Payer: Self-pay | Admitting: Physical Therapy

## 2020-07-20 DIAGNOSIS — R279 Unspecified lack of coordination: Secondary | ICD-10-CM | POA: Insufficient documentation

## 2020-07-20 DIAGNOSIS — M6281 Muscle weakness (generalized): Secondary | ICD-10-CM | POA: Diagnosis present

## 2020-07-20 NOTE — Therapy (Addendum)
[place] at [facility] [address] [place], [place], [address] Phone: [phone]   Fax:  [phone]  Physical Therapy Treatment  Patient Details  Name: Eileen Melton MRN: [id number] Date of Birth: December 04, 1996 Referring Provider (PT): Dr. Noni Saupe   Encounter Date: 07/20/2020   PT End of Session - 07/20/20 1038    Visit Number 2    Date for PT Re-Evaluation 10/05/20    Authorization Type [place] Medicaid [employer]    PT Start Time 1030    PT Stop Time 1115    PT Time Calculation (min) 45 min    Activity Tolerance Patient tolerated treatment well;No increased pain    Behavior During Therapy WFL for tasks assessed/performed           Past Medical History:  Diagnosis Date  . Anxiety    per patient    Past Surgical History:  Procedure Laterality Date  . NO PAST SURGERIES    . WISDOM TOOTH EXTRACTION      There were no vitals filed for this visit.   Subjective Assessment - 07/20/20 1035    Subjective My right hip started to lock up yesterday. I had trouble putting pressure on the right leg. I feel the stitches are disolving.    Patient Stated Goals learn how to exercise the pelvic floor and how to get body back since giving birth    Currently in Pain? Yes    Pain Score 2     Pain Location Vagina    Pain Orientation Mid    Pain Descriptors / Indicators Pressure;Discomfort    Pain Type Acute pain    Pain Onset More than a month ago    Pain Frequency Intermittent    Aggravating Factors  When stand up too long, grocery shopping    Pain Relieving Factors sit to one isde    Multiple Pain Sites No                             [facility] Adult PT Treatment/Exercise - 07/20/20 0001      Lumbar Exercises: Supine   Other Supine Lumbar Exercises feet on wall with ball squeeze and abodminal contraction with alternate shoulder flexion      Lumbar Exercises: Sidelying   Other Sidelying Lumbar Exercises  sidely thoracic rotation with breathi to improve rib mobility bill.     Other Sidelying Lumbar Exercises iliacus pull back bil.       Lumbar Exercises: Quadruped   Madcat/Old Horse 15 reps    Madcat/Old Horse Limitations with tactile cues to open up the L4-L5 facets      Manual Therapy   Manual Therapy Soft tissue mobilization;Myofascial release;Muscle Energy Technique;Joint mobilization    Joint Mobilization P-A and rotational mobilization to T4-L5    Soft tissue mobilization bil. lumbar paraspinals, quadratus, along the lateral abdomials    Myofascial Release quadrupe pulling the fascial poseiorly to anterior    Muscle Energy Technique to correct the righ tilum                  PT Education - 07/20/20 1120    Education Details rib mobility in sidely, quadruped breathinging into posterior rib cage, supine feet on wall with alternate shoulder flexion    Person(s) Educated Patient    Methods Explanation;Demonstration;Handout    Comprehension Verbalized understanding;Returned demonstration            PT Short  Term Goals - 07/20/20 1125      PT SHORT TERM GOAL #1   Title independent with initial HEP    Period Weeks    Status Achieved             PT Long Term Goals - 07/13/20 1122      PT LONG TERM GOAL #1   Title dependent with advanced HEP    Baseline not educated yet    Time 12    Period Weeks    Status New    Target Date 10/05/20      PT LONG TERM GOAL #2   Title able to contract her abdominal sfor bracing to perform her daily tasks    Baseline diastasis is 2 fingers width apart    Time 12    Period Weeks    Status New    Target Date 10/05/20      PT LONG TERM GOAL #3   Title able to contract the pelvic floora nd improve endurance to reduce pressuer with shopping and standing to minimal to none    Baseline pressure with standing and shopping due to giving birth 2 weeks ago and has stitiches    Time 12    Period Weeks    Status New    Target Date  10/05/20      PT LONG TERM GOAL #4   Title able to take care of the baby with correct body mechanics and correct core stabilization to reduce strain on the pelvic floor    Baseline not educated yet    Time 12    Period Weeks    Status New    Target Date 10/05/20                 Plan - 07/20/20 1121    Clinical Impression Statement After manual work pelvis in correct alignment. Patient has tightness in the back and lateral abdominals. She is able to expand the midback and 360 breathing. Patient is starting to engage her abdominals correctly. Her vaginal pain has decreased to 2/10. Patient stitiches are dissolving. Patient will benefi tfrom skilled therapy to reduce the pain and improve her strength.    Personal Factors and Comorbidities Comorbidity 1;Fitness    Comorbidities baginal birth with second degree tear on 06/28/2020    Examination-Activity Limitations Caring for Others;Stand;Lift    Examination-Participation Restrictions Shop    Stability/Clinical Decision Making Stable/Uncomplicated    Rehab Potential Excellent    PT Frequency 1x / week    PT Duration 12 weeks    PT Treatment/Interventions ADLs/Self Care Home Management;Electrical Stimulation;Moist Heat;Therapeutic exercise;Therapeutic activities;Neuromuscular re-education;Patient/family education;Manual techniques;Spinal Manipulations    PT Next Visit Plan phase 1 of post partum; reverse clame, piriormis stretch, check diastasis, hip hinge for picking up baby, go over posture with breast feeding    Recommended Other Services MD signed initial eval    Consulted and Agree with Plan of Care Patient           Patient will benefit from skilled therapeutic intervention in order to improve the following deficits and impairments:  Decreased endurance, Pain, Decreased activity tolerance, Decreased range of motion, Increased muscle spasms, Decreased strength  Visit Diagnosis: Muscle weakness (generalized)  Unspecified lack  of coordination     Problem List Patient Active Problem List   Diagnosis Date Noted  . Post-dates pregnancy 06/26/2020  . Echogenic intracardiac focus of fetus on prenatal ultrasound 03/09/2020  . Supervision of low-risk pregnancy 12/10/2019  . Anxiety  12/10/2019  . Marijuana use 10/21/2018    Earlie Counts, PT 07/20/20 11:26 AM   [place] at [facility] [address] [place], [place], [address] Phone: [phone]   Fax:  [phone]  Name: Eileen Melton MRN: [id number] Date of Birth: 1996-11-20  PHYSICAL THERAPY DISCHARGE SUMMARY  Visits from Start of Care: 2  Current functional level related to goals / functional outcomes: See above. Patient has no-showed for her last 2 appointments. Therapist has called patient for each no-show and left messages. Patient is being discharged due to our attendance policy.    Remaining deficits: See above.    Education / Equipment: HEP Plan:                                                    Patient goals were not met. Patient is being discharged due to not returning since the last visit. Thank you for the referral. Earlie Counts, PT 08/10/20 10:53 AM   ?????

## 2020-07-27 ENCOUNTER — Other Ambulatory Visit (HOSPITAL_COMMUNITY)
Admission: RE | Admit: 2020-07-27 | Discharge: 2020-07-27 | Disposition: A | Discharge status: Home / Self Care | Payer: Medicaid Other | Source: Ambulatory Visit | Attending: Obstetrics and Gynecology | Admitting: Obstetrics and Gynecology

## 2020-07-27 ENCOUNTER — Encounter: Payer: Self-pay | Admitting: Physical Therapy

## 2020-07-27 ENCOUNTER — Ambulatory Visit (INDEPENDENT_AMBULATORY_CARE_PROVIDER_SITE_OTHER): Payer: Medicaid Other | Admitting: Obstetrics and Gynecology

## 2020-07-27 ENCOUNTER — Encounter: Payer: Self-pay | Admitting: Obstetrics and Gynecology

## 2020-07-27 ENCOUNTER — Other Ambulatory Visit: Payer: Self-pay

## 2020-07-27 VITALS — BP 112/76 | HR 92 | Wt 156.4 lb

## 2020-07-27 DIAGNOSIS — F419 Anxiety disorder, unspecified: Secondary | ICD-10-CM

## 2020-07-27 DIAGNOSIS — N898 Other specified noninflammatory disorders of vagina: Secondary | ICD-10-CM | POA: Diagnosis not present

## 2020-07-27 DIAGNOSIS — O99013 Anemia complicating pregnancy, third trimester: Secondary | ICD-10-CM

## 2020-07-27 DIAGNOSIS — F1290 Cannabis use, unspecified, uncomplicated: Secondary | ICD-10-CM | POA: Diagnosis not present

## 2020-07-27 DIAGNOSIS — O99019 Anemia complicating pregnancy, unspecified trimester: Secondary | ICD-10-CM | POA: Insufficient documentation

## 2020-07-27 NOTE — Progress Notes (Signed)
Post Partum Visit Note  Eileen Melton is a 24 y.o. G107P1011 female who presents for a postpartum visit. She is 4 weeks postpartum following a normal spontaneous vaginal delivery.  I have fully reviewed the prenatal and intrapartum course. The delivery was at [redacted]w[redacted]d gestational weeks.  Anesthesia: none. Postpartum course has been overall uncomplicated. Baby is doing well. Baby is feeding by both breast and bottle - [employer]. Bleeding no bleeding. Bowel function is abnormal: reports BM three times a week instead of three times a day. Bladder function is normal. Patient is not sexually active. Contraception method is condoms. Postpartum depression screening: negative.  Pt concerned that breasts feel firm, red and warm even after feeding. Nipples burn with feeding. Baby often falls asleep after feeding on 1 side. Baby has a shallow latch. Electric pump at home but painful at times. No plans to return to work in near future.  The vagina on left side feels itchy with "pins and needles".  Discharge has a smell but not increased amount from prior. No associated abdominal pain or itching.  Pap normal 12/17/19.  The pregnancy intention screening data noted above was reviewed. Potential methods of contraception were discussed. The patient elected to proceed with Female Condom.   No safety or mood concerns.  The following portions of the patient's history were reviewed and updated as appropriate: allergies, current medications, past family history, past medical history, past social history, past surgical history and problem list.  Review of Systems Pertinent items noted in HPI and remainder of comprehensive ROS otherwise negative.    Objective:  Last menstrual period 09/07/2019, unknown if currently breastfeeding.  General:  alert, cooperative and appears stated age   Breasts:  inspection negative, no nipple discharge or bleeding, no masses or nodularity palpable, slightly tender to palpation  throughout  Lungs: normal WOB  Heart:  Regular rate and rhythm  Abdomen: soft, nontender   Vulva:  normal  Vagina: normal vagina  Cervix:  not examined  Corpus: not examined  Adnexa:  not evaluated  Rectal Exam: Not performed.        Assessment:    Normal postpartum exam. Pap smear not done at today's visit.   Plan:   Essential components of care per ACOG recommendations:  1.  Mood and well being: Patient with negative depression screening today. Reviewed local resources for support.  - Patient does not use tobacco.  - hx of drug use? No    2. Infant care and feeding:  -Patient currently breastmilk feeding? Yes--given concerns with breast feeding as noted in HPI will plan for f/u lactation appt (message sent today to schedule) -Social determinants of health (SDOH) reviewed in EPIC. No concerns reported.  3. Sexuality, contraception and birth spacing - Patient does not want a pregnancy in the next year.  Desired family size is undecided.  - Reviewed forms of contraception in tiered fashion. Patient desired condoms today.   - Discussed birth spacing of 18 months  4. Sleep and fatigue -Encouraged family/partner/community support of 4 hrs of uninterrupted sleep to help with mood and fatigue  5. Physical Recovery  - Discussed patients delivery and complications - Patient had a second degree laceration, perineal healing reviewed. Patient expressed understanding - Patient has urinary incontinence? No - Patient is safe to resume physical and sexual activity  6.  Health Maintenance - Last pap smear done 12/17/19 and was normal with negative HPV.  7. Anemia in Pregnancy: pt previously had issues with "feeling  cold" prior to pregnancy. No longer taking iron supplement given issues with constipation. - f/u repeat CBC, ferritin today  8. Anxiety: no current medications. Pt reports improvement in anxiety today. - provided strict return precautions for mood/safety concerns.  Sheila Oats, MD  OB Fellow, Faculty [facility]

## 2020-07-27 NOTE — Patient Instructions (Addendum)
We checked your blood levels and iron level today given your history of anemia. We recommend drinking plenty of water and eating lots of fruits and vegetables to help with constipation. You may also use miralax daily as needed for constipation. Please call if you have concerns of fever with breast pain and redness, especially if one side more than the other.  Postpartum Care After Vaginal Delivery This sheet gives you information about how to care for yourself from the time you deliver your baby to up to 6-12 weeks after delivery (postpartum period). Your health care provider may also give you more specific instructions. If you have problems or questions, contact your health care provider. Follow these instructions at home: Vaginal bleeding  It is normal to have vaginal bleeding (lochia) after delivery. Wear a sanitary pad for vaginal bleeding and discharge. ? During the first week after delivery, the amount and appearance of lochia is often similar to a menstrual period. ? Over the next few weeks, it will gradually decrease to a dry, yellow-brown discharge. ? For most women, lochia stops completely by 4-6 weeks after delivery. Vaginal bleeding can vary from woman to woman.  Change your sanitary pads frequently. Watch for any changes in your flow, such as: ? A sudden increase in volume. ? A change in color. ? Large blood clots.  If you pass a blood clot from your vagina, save it and call your health care provider to discuss. Do not flush blood clots down the toilet before talking with your health care provider.  Do not use tampons or douches until your health care provider says this is safe.  If you are not breastfeeding, your period should return 6-8 weeks after delivery. If you are feeding your child breast milk only (exclusive breastfeeding), your period may not return until you stop breastfeeding. Perineal care  Keep the area between the vagina and the anus (perineum) clean and dry as  told by your health care provider. Use medicated pads and pain-relieving sprays and creams as directed.  If you had a cut in the perineum (episiotomy) or a tear in the vagina, check the area for signs of infection until you are healed. Check for: ? More redness, swelling, or pain. ? Fluid or blood coming from the cut or tear. ? Warmth. ? Pus or a bad smell.  You may be given a squirt bottle to use instead of wiping to clean the perineum area after you go to the bathroom. As you start healing, you may use the squirt bottle before wiping yourself. Make sure to wipe gently.  To relieve pain caused by an episiotomy, a tear in the vagina, or swollen veins in the anus (hemorrhoids), try taking a warm sitz bath 2-3 times a day. A sitz bath is a warm water bath that is taken while you are sitting down. The water should only come up to your hips and should cover your buttocks. Breast care  Within the first few days after delivery, your breasts may feel heavy, full, and uncomfortable (breast engorgement). Milk may also leak from your breasts. Your health care provider can suggest ways to help relieve the discomfort. Breast engorgement should go away within a few days.  If you are breastfeeding: ? Wear a bra that supports your breasts and fits you well. ? Keep your nipples clean and dry. Apply creams and ointments as told by your health care provider. ? You may need to use breast pads to absorb milk that leaks from  your breasts. ? You may have uterine contractions every time you breastfeed for up to several weeks after delivery. Uterine contractions help your uterus return to its normal size. ? If you have any problems with breastfeeding, work with your health care provider or Advertising copywriter.  If you are not breastfeeding: ? Avoid touching your breasts a lot. Doing this can make your breasts produce more milk. ? Wear a good-fitting bra and use cold packs to help with swelling. ? Do not squeeze  out (express) milk. This causes you to make more milk. Intimacy and sexuality  Ask your health care provider when you can engage in sexual activity. This may depend on: ? Your risk of infection. ? How fast you are healing. ? Your comfort and desire to engage in sexual activity.  You are able to get pregnant after delivery, even if you have not had your period. If desired, talk with your health care provider about methods of birth control (contraception). Medicines  Take over-the-counter and prescription medicines only as told by your health care provider.  If you were prescribed an antibiotic medicine, take it as told by your health care provider. Do not stop taking the antibiotic even if you start to feel better. Activity  Gradually return to your normal activities as told by your health care provider. Ask your health care provider what activities are safe for you.  Rest as much as possible. Try to rest or take a nap while your baby is sleeping. Eating and drinking   Drink enough fluid to keep your urine pale yellow.  Eat high-fiber foods every day. These may help prevent or relieve constipation. High-fiber foods include: ? Whole grain cereals and breads. ? Brown rice. ? Beans. ? Fresh fruits and vegetables.  Do not try to lose weight quickly by cutting back on calories.  Take your prenatal vitamins until your postpartum checkup or until your health care provider tells you it is okay to stop. Lifestyle  Do not use any products that contain nicotine or tobacco, such as cigarettes and e-cigarettes. If you need help quitting, ask your health care provider.  Do not drink alcohol, especially if you are breastfeeding. General instructions  Keep all follow-up visits for you and your baby as told by your health care provider. Most women visit their health care provider for a postpartum checkup within the first 3-6 weeks after delivery. Contact a health care provider if:  You feel  unable to cope with the changes that your child brings to your life, and these feelings do not go away.  You feel unusually sad or worried.  Your breasts become red, painful, or hard.  You have a fever.  You have trouble holding urine or keeping urine from leaking.  You have little or no interest in activities you used to enjoy.  You have not breastfed at all and you have not had a menstrual period for 12 weeks after delivery.  You have stopped breastfeeding and you have not had a menstrual period for 12 weeks after you stopped breastfeeding.  You have questions about caring for yourself or your baby.  You pass a blood clot from your vagina. Get help right away if:  You have chest pain.  You have difficulty breathing.  You have sudden, severe leg pain.  You have severe pain or cramping in your lower abdomen.  You bleed from your vagina so much that you fill more than one sanitary pad in one hour. Bleeding should  not be heavier than your heaviest period.  You develop a severe headache.  You faint.  You have blurred vision or spots in your vision.  You have bad-smelling vaginal discharge.  You have thoughts about hurting yourself or your baby. If you ever feel like you may hurt yourself or others, or have thoughts about taking your own life, get help right away. You can go to the nearest emergency department or call:  Your local emergency services (911 in the [place]  A suicide crisis helpline, such as the National Suicide Prevention Lifeline at (206)181-0785. This is open 24 hours a day. Summary  The period of time right after you deliver your newborn up to 6-12 weeks after delivery is called the postpartum period.  Gradually return to your normal activities as told by your health care provider.  Keep all follow-up visits for you and your baby as told by your health care provider. This information is not intended to replace advice given to you by your health care  provider. Make sure you discuss any questions you have with your health care provider. Document Revised: 12/07/2017 Document Reviewed: 09/17/2017 Elsevier Patient Education  2020 [employer].

## 2020-07-28 ENCOUNTER — Encounter: Payer: Self-pay | Admitting: Obstetrics and Gynecology

## 2020-07-28 ENCOUNTER — Telehealth: Payer: Self-pay | Admitting: Obstetrics and Gynecology

## 2020-07-28 DIAGNOSIS — B9689 Other specified bacterial agents as the cause of diseases classified elsewhere: Secondary | ICD-10-CM

## 2020-07-28 DIAGNOSIS — N760 Acute vaginitis: Secondary | ICD-10-CM

## 2020-07-28 LAB — CBC
Hematocrit: 42.4 % (ref 34.0–46.6)
Hemoglobin: 13.8 g/dL (ref 11.1–15.9)
MCH: 25.4 pg — ABNORMAL LOW (ref 26.6–33.0)
MCHC: 32.5 g/dL (ref 31.5–35.7)
MCV: 78 fL — ABNORMAL LOW (ref 79–97)
Platelets: 257 10*3/uL (ref 150–450)
RBC: 5.43 x10E6/uL — ABNORMAL HIGH (ref 3.77–5.28)
RDW: 21.2 % — ABNORMAL HIGH (ref 11.7–15.4)
WBC: 7.4 10*3/uL (ref 3.4–10.8)

## 2020-07-28 LAB — CERVICOVAGINAL ANCILLARY ONLY
Bacterial Vaginitis (gardnerella): POSITIVE — AB
Candida Glabrata: NEGATIVE
Candida Vaginitis: NEGATIVE
Chlamydia: NEGATIVE
Comment: NEGATIVE
Comment: NEGATIVE
Comment: NEGATIVE
Comment: NEGATIVE
Comment: NEGATIVE
Comment: NORMAL
Neisseria Gonorrhea: NEGATIVE
Trichomonas: NEGATIVE

## 2020-07-28 LAB — FERRITIN: Ferritin: 26 ng/mL (ref 15–150)

## 2020-07-28 MED ORDER — METRONIDAZOLE 500 MG PO TABS: 500.0000 mg | ORAL_TABLET | Freq: Two times a day (BID) | ORAL | 0 refills | Status: AC

## 2020-07-28 NOTE — Telephone Encounter (Signed)
Sent Mychart message for pt regarding new prescription for flagyl given diagnosis of BV on wet prep with reported vaginal symptoms. Script sent to pt's preferred pharmacy.

## 2020-08-03 ENCOUNTER — Encounter: Payer: Medicaid Other | Admitting: Physical Therapy

## 2020-08-03 ENCOUNTER — Telehealth: Payer: Self-pay | Admitting: Physical Therapy

## 2020-08-03 NOTE — Telephone Encounter (Signed)
Called patient about her no-show to her appointment today. Her appointment was at 10:30.  Eulis Foster, PT @8 /17/2021@ 10:51 AM '

## 2020-08-10 ENCOUNTER — Telehealth: Payer: Self-pay | Admitting: Physical Therapy

## 2020-08-10 ENCOUNTER — Encounter: Payer: Medicaid Other | Admitting: Physical Therapy

## 2020-08-10 NOTE — Telephone Encounter (Signed)
Called patient about her no-show appointment at 10:30. Left a message.  Eulis Foster, PT @8 /24/2021@ 10:49 AM

## 2021-06-23 IMAGING — US US MFM OB FOLLOW-UP
1 series · 13 of 28 positions shown · non-contrast
Comparison: none

[Series 1: us mfm ob follow-up · 13 of 86 slices shown]
[im 4/86]
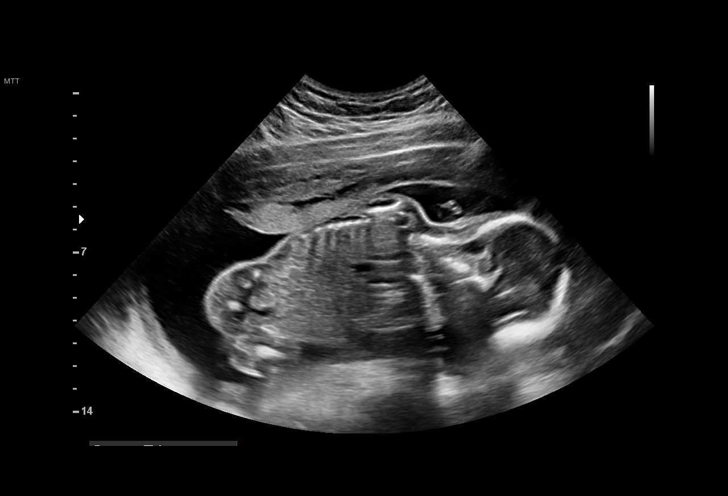
[im 10/86]
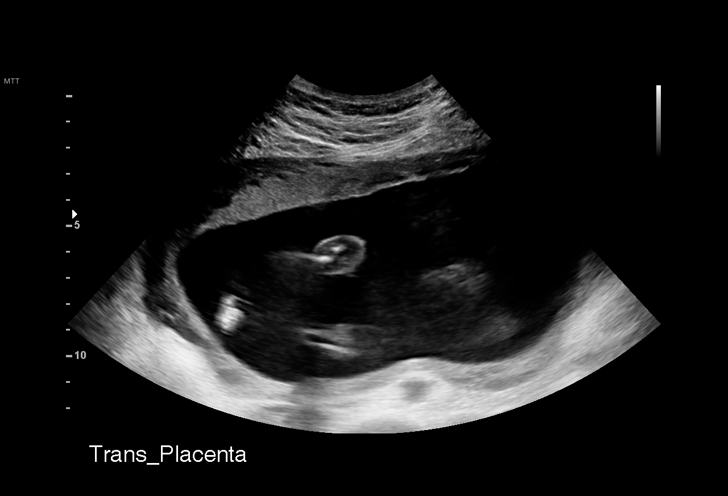
[im 16/86]
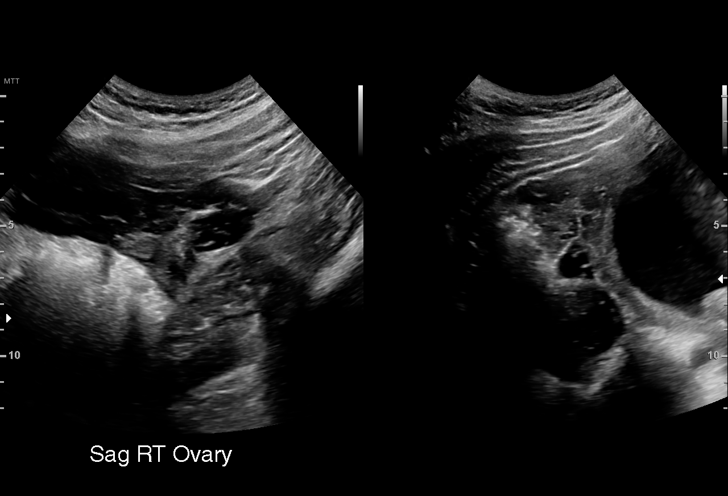
[im 23/86]
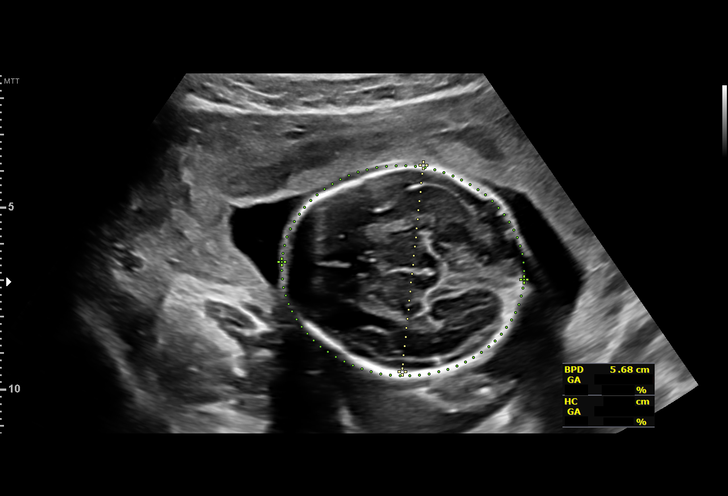
[im 29/86]
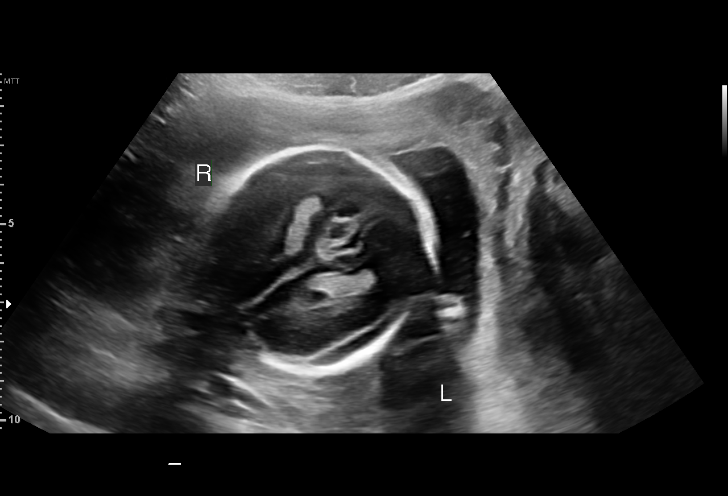
[im 35/86]
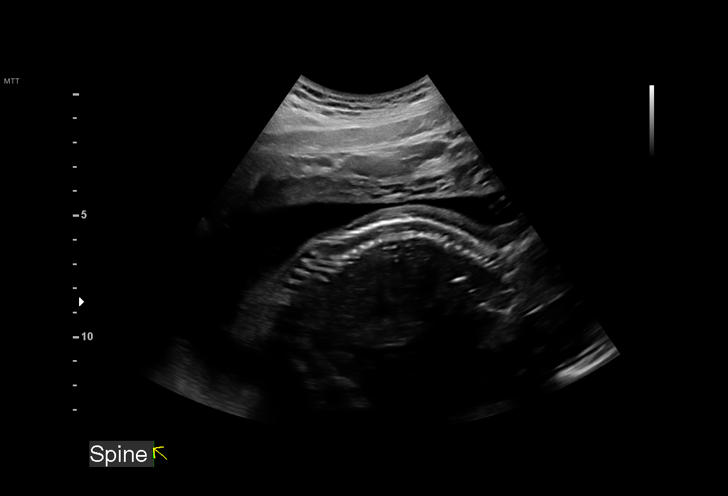
[im 45/86]
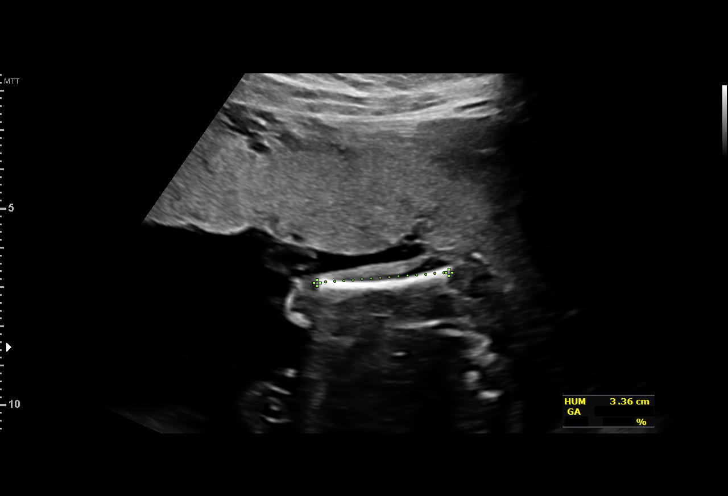
[im 51/86]
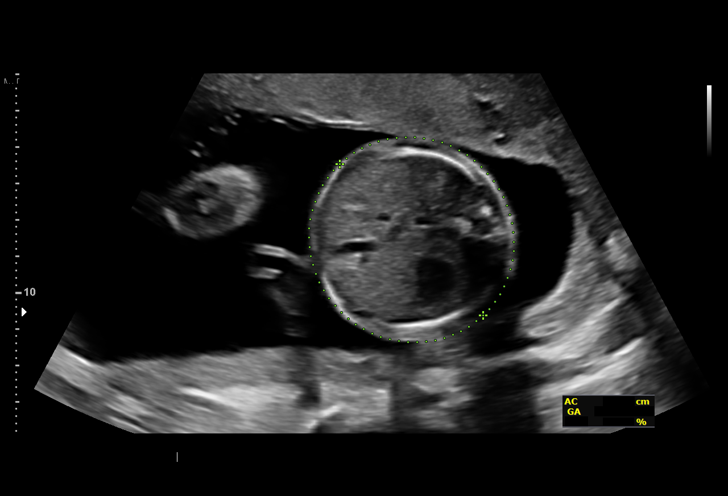
[im 57/86]
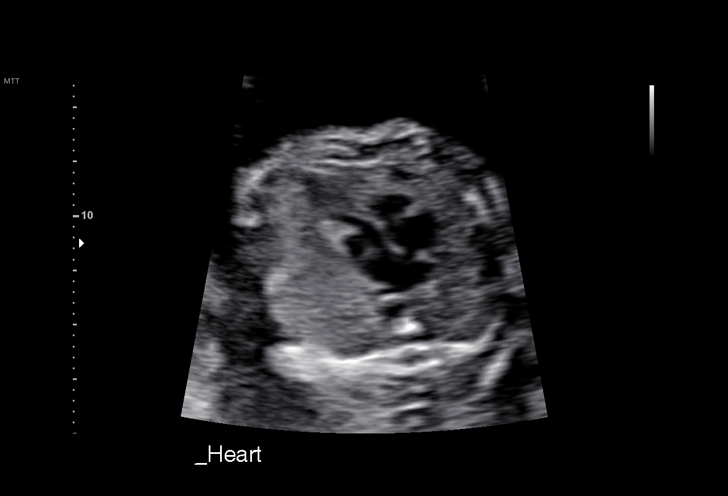
[im 63/86]
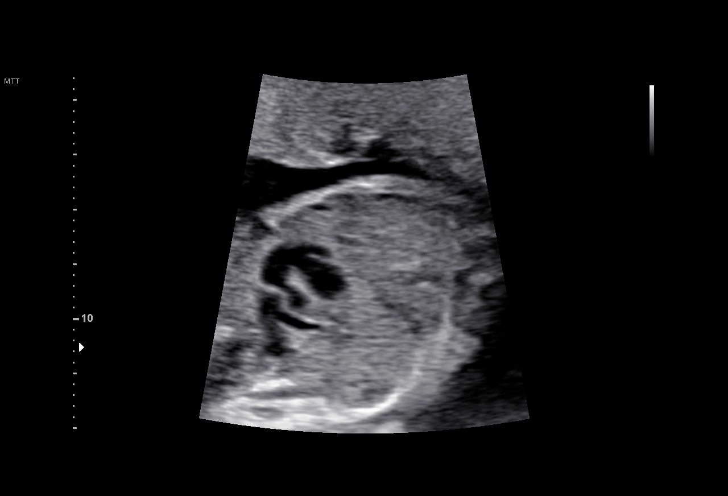
[im 70/86]
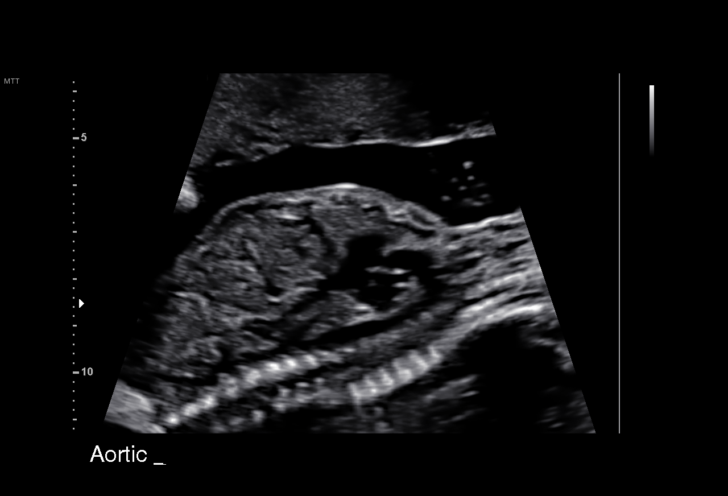
[im 76/86]
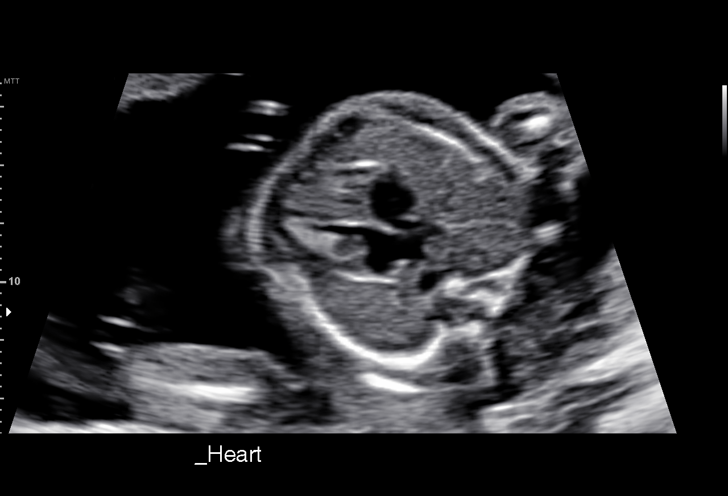
[im 82/86]
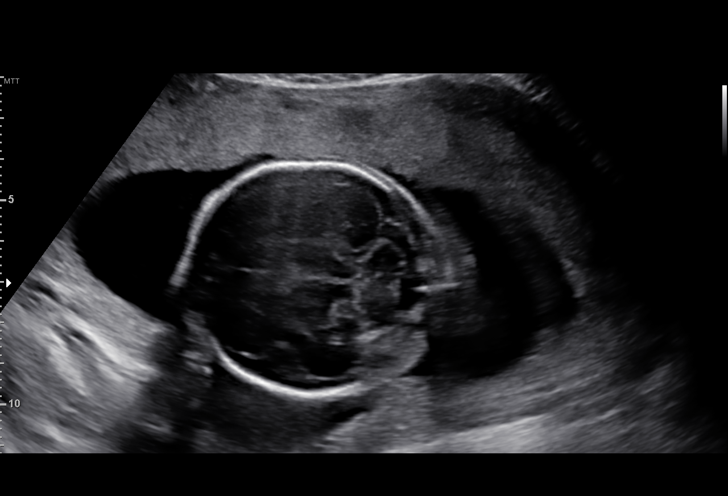

[13 of 28 positions shown; findings below may reference images not displayed]

[address]

 ----------------------------------------------------------------------

 ----------------------------------------------------------------------
Indications

  Abnormal fetal ultrasound (EIF)
  22 weeks gestation of pregnancy
  Antenatal follow-up for nonvisualized fetal
  anatomy
 ----------------------------------------------------------------------
Fetal Evaluation

 Num Of Fetuses:         1
 Fetal Heart Rate(bpm):  148
 Cardiac Activity:       Observed
 Presentation:           Cephalic
 Placenta:               Anterior
 P. Cord Insertion:      Previously Visualized

 Amniotic Fluid
 AFI FV:      Within normal limits
Biometry

 BPD:      56.8  mm     G. Age:  23w 3d         84  %    CI:        84.27   %    70 - 86
                                                         FL/HC:      19.4   %    18.4 -
 HC:       195   mm     G. Age:  21w 5d         17  %    HC/AC:      1.10        1.06 -
 AC:      176.9  mm     G. Age:  22w 4d         52  %    FL/BPD:     66.7   %    71 - 87
 FL:       37.9  mm     G. Age:  22w 1d         34  %    FL/AC:      21.4   %    20 - 24
 HUM:      34.1  mm     G. Age:  21w 4d         32  %
 CER:      23.7  mm     G. Age:  21w 6d         41  %

 LV:        5.5  mm
 CM:        5.1  mm

 Est. FW:     497  gm      1 lb 2 oz     47  %
OB History
 Blood Type:    A+
 Gravidity:    2         Term:   0        Prem:   0        SAB:   0
 TOP:          1       Ectopic:  0        Living: 0
Gestational Age

 LMP:           23w 2d        Date:  09/07/19                 EDD:   06/13/20
 U/S Today:     22w 3d                                        EDD:   06/19/20
 Best:          22w 2d     Det. By:  U/S  (01/19/20)          EDD:   06/20/20
Anatomy

 Cranium:               Appears normal         LVOT:                   Appears normal
 Cavum:                 Appears normal         Aortic Arch:            Appears normal
 Ventricles:            Appears normal         Ductal Arch:            Appears normal
 Choroid Plexus:        Appears normal         Diaphragm:              Appears normal
 Cerebellum:            Appears normal         Stomach:                Appears normal, left
                                                                       sided
 Posterior Fossa:       Appears normal         Abdomen:                Previously seen
 Nuchal Fold:           Previously seen        Abdominal Wall:         Previously seen
 Face:                  Appears normal         Cord Vessels:           Previously seen
                        (orbits and profile)
 Lips:                  Appears normal         Kidneys:                Appear normal
 Palate:                Previously seen        Bladder:                Appears normal
 Thoracic:              Previously seen        Spine:                  Appears normal
 Heart:                 Echogenic focus        Upper Extremities:      Previously seen
                        in LV
 RVOT:                  Appears normal         Lower Extremities:      Previously seen

 Other:  Parents do not wish to know sex of fetus at this time, Male gender
         previously seen.  Heels and 5th digit visualized previously. Nasal
         bone visualized previously.
Cervix Uterus Adnexa

 Cervix
 Length:           3.56  cm.
 Normal appearance by transabdominal scan.

 Uterus
 No abnormality visualized.

 Left Ovary
 Within normal limits. No adnexal mass visualized.

 Right Ovary
 Within normal limits. No adnexal mass visualized.

 Cul De Sac
 No free fluid seen.

 Adnexa
 No abnormality visualized.
Comments

 This patient was seen for a follow up exam as the fetal
 cardiac views were unable to be fully visualized during her
 prior exam.  She denies any problems since her last exam.
 She was informed that the fetal growth and amniotic fluid
 level appears appropriate for her gestational age.
 The views of the fetal heart were visualized today.  There
 were no obvious anomalies suspected, other than the
 previously noted intracardiac echogenic focus.  The
 limitations of ultrasound in the detection of all anomalies was
 discussed.
 Follow-up as indicated.

## 2022-06-02 ENCOUNTER — Encounter (HOSPITAL_COMMUNITY): Payer: Self-pay

## 2022-06-02 ENCOUNTER — Ambulatory Visit (HOSPITAL_COMMUNITY)
Admission: EM | Admit: 2022-06-02 | Discharge: 2022-06-02 | Disposition: A | Discharge status: Home / Self Care | Payer: Medicaid Other | Attending: Student | Admitting: Student

## 2022-06-02 DIAGNOSIS — K30 Functional dyspepsia: Secondary | ICD-10-CM

## 2022-06-02 MED ORDER — DICYCLOMINE HCL 20 MG PO TABS: 20.0000 mg | ORAL_TABLET | Freq: Two times a day (BID) | ORAL | 0 refills | Status: DC | PRN

## 2022-06-02 NOTE — ED Provider Notes (Signed)
[facility]    CSN: [id number] Arrival date & time: 06/02/22  1407      History   Chief Complaint Chief Complaint  Patient presents with   Abdominal Pain   Diarrhea    HPI Eileen Melton is a 26 y.o. female presenting with epigastric pain and diarrhea x4 days. History noncontributory. LMP 05/24/22, she is not breastfeeding.  She states that she initially had an episode of vomiting 7 days ago, this resolved but lingering epigastric pain.  She states that she has had copious watery diarrhea for the last 5 days, associated with gas.  Diarrhea is never black or dark red, denies BRBPR.  Unsure of trigger, denies recent travel or eating out.  Denies sick contacts.  Tolerating fluids by mouth.  Has not attempted medications for this. Denies abd procedures or surgeries. Has not identified triggers to relieving factors to the symptoms.   HPI  Past Medical History:  Diagnosis Date   Anxiety    per patient   Echogenic intracardiac focus of fetus on prenatal ultrasound 03/09/2020   Noted on initial and follow up.   Post-dates pregnancy 06/26/2020    Patient Active Problem List   Diagnosis Date Noted   Postpartum care and examination 07/27/2020   Anemia in pregnancy 07/27/2020   Anxiety 12/10/2019   Marijuana use 10/21/2018    Past Surgical History:  Procedure Laterality Date   NO PAST SURGERIES     WISDOM TOOTH EXTRACTION      OB History     Gravida  2   Para  1   Term  1   Preterm  0   AB  1   Living  1      SAB  0   IAB  1   Ectopic  0   Multiple  0   Live Births  1            Home Medications    Prior to Admission medications   Medication Sig Start Date End Date Taking? Authorizing Provider  dicyclomine (BENTYL) 20 MG tablet Take 1 tablet (20 mg total) by mouth 2 (two) times daily as needed for spasms. 06/02/22  Yes Hazel Sams, PA-C    Family History Family History  Problem Relation Age of Onset   Aneurysm Mother    Cancer  Maternal Grandmother    Arthritis Father    Hypertension Father    Hyperlipidemia Father     Social History Social History   Tobacco Use   Smoking status: Former    Types: Cigars    Quit date: 10/10/2019    Years since quitting: 2.6   Smokeless tobacco: Never  Vaping Use   Vaping Use: Never used  Substance Use Topics   Alcohol use: Not Currently    Comment: holidays   Drug use: Not Currently    Frequency: 7.0 times per week    Types: Marijuana    Comment: stopped + preg- 10/20     Allergies   Hydrocodone-acetaminophen and Kiwi extract   Review of Systems Review of Systems  Gastrointestinal:  Positive for abdominal pain and diarrhea. Negative for nausea and vomiting.  All other systems reviewed and are negative.    Physical Exam Triage Vital Signs ED Triage Vitals [06/02/22 1457]  Enc Vitals Group     BP 120/84     Pulse Rate 77     Resp 18     Temp 98.2 F (36.8 C)  Temp Source Oral     SpO2 99 %     Weight      Height      Head Circumference      Peak Flow      Pain Score 2     Pain Loc      Pain Edu?      Excl. in [place]?    No data found.  Updated Vital Signs BP 120/84 (BP Location: Left Arm)   Pulse 77   Temp 98.2 F (36.8 C) (Oral)   Resp 18   LMP 05/24/2022   SpO2 99%   Breastfeeding No   Visual Acuity Right Eye Distance:   Left Eye Distance:   Bilateral Distance:    Right Eye Near:   Left Eye Near:    Bilateral Near:     Physical Exam Vitals reviewed.  Constitutional:      General: She is not in acute distress.    Appearance: Normal appearance. She is not ill-appearing.  HENT:     Head: Normocephalic and atraumatic.     Mouth/Throat:     Mouth: Mucous membranes are moist.     Comments: Moist mucous membranes Eyes:     Extraocular Movements: Extraocular movements intact.     Pupils: Pupils are equal, round, and reactive to light.  Cardiovascular:     Rate and Rhythm: Normal rate and regular rhythm.     Heart sounds:  Normal heart sounds.  Pulmonary:     Effort: Pulmonary effort is normal.     Breath sounds: Normal breath sounds. No wheezing, rhonchi or rales.  Abdominal:     General: Bowel sounds are normal. There is no distension.     Palpations: Abdomen is soft. There is no mass.     Tenderness: There is abdominal tenderness in the epigastric area. There is no right CVA tenderness, left CVA tenderness, guarding or rebound. Negative signs include Murphy's sign, Rovsing's sign and McBurney's sign.     Comments: Mild epigastric pain to palpation. No guarding rebound mass or hernia.   Skin:    General: Skin is warm.     Capillary Refill: Capillary refill takes less than 2 seconds.     Comments: Good skin turgor  Neurological:     General: No focal deficit present.     Mental Status: She is alert and oriented to person, place, and time.  Psychiatric:        Mood and Affect: Mood normal.        Behavior: Behavior normal.      UC Treatments / Results  Labs (all labs ordered are listed, but only abnormal results are displayed) Labs Reviewed  GASTROINTESTINAL PANEL BY PCR, STOOL (REPLACES STOOL CULTURE)    EKG   Radiology No results found.  Procedures Procedures (including critical care time)  Medications Ordered in UC Medications - No data to display  Initial Impression / Assessment and Plan / UC Course  I have reviewed the triage vital signs and the nursing notes.  Pertinent labs & imaging results that were available during my care of the patient were reviewed by me and considered in my medical decision making (see chart for details).     This patient is a very pleasant 26 y.o. year old female presenting with epigastric pain and diarrhea. Initially with vomiting but no longer. Afebrile, nontachy. Abd pain is limited to the epigastric region with no guarding or rebound. Tolerating fluids PO, no n/v. LMP 05/24/22, States she  is not pregnant or breastfeeding. Has not attempted medications  for the symptoms. No recent abx. Will check a stool PCR at patient request. Bentyl sent, she declines zofran. Also rec imodium. ED return precautions discussed. Patient verbalizes understanding and agreement.   Final Clinical Impressions(s) / UC Diagnoses   Final diagnoses:  Indigestion     Discharge Instructions      -Dicyclomine for abdominal cramping and pain as needed up to twice daily  -You can purchase Imodium (loperamide) over-the-counter. Take the Imodium (loperamide) up to 4 times daily for diarrhea. -Drink plenty of fluids and eat a bland diet  -Bring the stool kit back to [place] to check for bacteria    ED Prescriptions     Medication Sig Dispense Auth. Provider   dicyclomine (BENTYL) 20 MG tablet Take 1 tablet (20 mg total) by mouth 2 (two) times daily as needed for spasms. 20 tablet Hazel Sams, PA-C      PDMP not reviewed this encounter.   Hazel Sams, PA-C 06/02/22 1539

## 2022-06-02 NOTE — ED Triage Notes (Signed)
Pt c/o upper abdominal pain with diarrhea x4 days. States had vomiting 6days prior. States has lots of gas.

## 2022-06-02 NOTE — Discharge Instructions (Addendum)
-  Dicyclomine for abdominal cramping and pain as needed up to twice daily  -You can purchase Imodium (loperamide) over-the-counter. Take the Imodium (loperamide) up to 4 times daily for diarrhea. -Drink plenty of fluids and eat a bland diet  -Bring the stool kit back to [place] to check for bacteria

## 2022-06-05 LAB — GASTROINTESTINAL PANEL BY PCR, STOOL (REPLACES STOOL CULTURE)

## 2022-08-11 ENCOUNTER — Encounter (HOSPITAL_COMMUNITY): Payer: Self-pay | Admitting: Emergency Medicine

## 2022-08-11 ENCOUNTER — Ambulatory Visit (HOSPITAL_COMMUNITY)
Admission: EM | Admit: 2022-08-11 | Discharge: 2022-08-11 | Disposition: A | Discharge status: Home / Self Care | Payer: Medicaid Other | Attending: Nurse Practitioner | Admitting: Nurse Practitioner

## 2022-08-11 DIAGNOSIS — N898 Other specified noninflammatory disorders of vagina: Secondary | ICD-10-CM | POA: Insufficient documentation

## 2022-08-11 DIAGNOSIS — Z202 Contact with and (suspected) exposure to infections with a predominantly sexual mode of transmission: Secondary | ICD-10-CM

## 2022-08-11 LAB — HIV ANTIBODY (ROUTINE TESTING W REFLEX): HIV Screen 4th Generation wRfx: NONREACTIVE

## 2022-08-11 NOTE — ED Triage Notes (Signed)
Pt reports vaginal itching and burning with and without urination and vaginal irritation causing redness x 3 weeks. Has tried Vagisil with some relief. Denies discharge and odor.

## 2022-08-11 NOTE — ED Provider Notes (Signed)
[facility]    CSN: [id number] Arrival date & time: 08/11/22  1951      History   Chief Complaint Chief Complaint  Patient presents with   vaginal irritation    HPI Eileen Melton is a 26 y.o. female.   HPI  The patient complains vaginal symptoms of burning and itching. Onset of symptoms was gradual starting 3 weeks ago. Severity of symptoms now is moderate. Symptoms occur spontaneous Symptoms have been intermittent. Symptoms are aggravated by urination, alleviated by vagisil and are associated with dysuria. Other modifying factors include none. Previous evaluation includes none. Care prior to arrival consisted of OTC ointment, with minimal relief  Past Medical History:  Diagnosis Date   Anxiety    per patient   Echogenic intracardiac focus of fetus on prenatal ultrasound 03/09/2020   Noted on initial and follow up.   Post-dates pregnancy 06/26/2020    Patient Active Problem List   Diagnosis Date Noted   Postpartum care and examination 07/27/2020   Anemia in pregnancy 07/27/2020   Anxiety 12/10/2019   Marijuana use 10/21/2018    Past Surgical History:  Procedure Laterality Date   NO PAST SURGERIES     WISDOM TOOTH EXTRACTION      OB History     Gravida  2   Para  1   Term  1   Preterm  0   AB  1   Living  1      SAB  0   IAB  1   Ectopic  0   Multiple  0   Live Births  1            Home Medications    Prior to Admission medications   Medication Sig Start Date End Date Taking? Authorizing Provider  dicyclomine (BENTYL) 20 MG tablet Take 1 tablet (20 mg total) by mouth 2 (two) times daily as needed for spasms. 06/02/22   Rhys Martini, PA-C    Family History Family History  Problem Relation Age of Onset   Aneurysm Mother    Cancer Maternal Grandmother    Arthritis Father    Hypertension Father    Hyperlipidemia Father     Social History Social History   Tobacco Use   Smoking status: Former    Types: Cigars     Quit date: 10/10/2019    Years since quitting: 2.8   Smokeless tobacco: Never  Vaping Use   Vaping Use: Never used  Substance Use Topics   Alcohol use: Not Currently    Comment: holidays   Drug use: Not Currently    Frequency: 7.0 times per week    Types: Marijuana    Comment: stopped + preg- 10/20     Allergies   Hydrocodone-acetaminophen and Kiwi extract   Review of Systems Review of Systems   Physical Exam Triage Vital Signs ED Triage Vitals  Enc Vitals Group     BP 08/11/22 2000 114/71     Pulse Rate 08/11/22 2000 77     Resp 08/11/22 2000 18     Temp 08/11/22 2000 98.3 F (36.8 C)     Temp Source 08/11/22 2000 Oral     SpO2 08/11/22 2000 98 %     Weight --      Height --      Head Circumference --      Peak Flow --      Pain Score 08/11/22 1959 0     Pain Loc --  Pain Edu? --      Excl. in [place]? --    No data found.  Updated Vital Signs BP 114/71 (BP Location: Right Arm)   Pulse 77   Temp 98.3 F (36.8 C) (Oral)   Resp 18   SpO2 98%   Visual Acuity Right Eye Distance:   Left Eye Distance:   Bilateral Distance:    Right Eye Near:   Left Eye Near:    Bilateral Near:     Physical Exam Exam conducted with a chaperone present.  Constitutional:      General: She is not in acute distress. HENT:     Head: Normocephalic.  Genitourinary:    Exam position: Lithotomy position.     Labia:        Right: Lesion present.   Neurological:     Mental Status: She is alert.      UC Treatments / Results  Labs (all labs ordered are listed, but only abnormal results are displayed)   EKG   Radiology No results found.  Procedures Procedures (including critical care time)  Medications Ordered in UC Medications - No data to display  Initial Impression / Assessment and Plan / UC Course  I have reviewed the triage vital signs and the nursing notes.  Pertinent labs & imaging results that were available during my care of the patient were  reviewed by me and considered in my medical decision making (see chart for details).    Vaginal discharge Exposure to STD Final Clinical Impressions(s) / UC Diagnoses   Final diagnoses:  Vaginal discharge  STD exposure     Discharge Instructions      Vaginal swabs pending Will call with any positive results and treated based on results      ED Prescriptions   None    PDMP not reviewed this encounter.   Thad Ranger [place], [place] 08/25/22 [phone]

## 2022-08-11 NOTE — Discharge Instructions (Addendum)
Vaginal swabs pending Will call with any positive results and treated based on results

## 2022-08-12 LAB — RPR: RPR Ser Ql: NONREACTIVE

## 2022-08-14 LAB — CERVICOVAGINAL ANCILLARY ONLY
Bacterial Vaginitis (gardnerella): NEGATIVE
Candida Glabrata: NEGATIVE
Candida Vaginitis: POSITIVE — AB
Chlamydia: NEGATIVE
Comment: NEGATIVE
Comment: NEGATIVE
Comment: NEGATIVE
Comment: NEGATIVE
Comment: NEGATIVE
Comment: NORMAL
Neisseria Gonorrhea: NEGATIVE
Trichomonas: NEGATIVE

## 2022-08-15 LAB — HSV CULTURE AND TYPING

## 2022-08-16 ENCOUNTER — Telehealth (HOSPITAL_COMMUNITY): Payer: Self-pay | Admitting: Emergency Medicine

## 2022-08-16 MED ORDER — FLUCONAZOLE 150 MG PO TABS: 150.0000 mg | ORAL_TABLET | Freq: Once | ORAL | 0 refills | Status: AC

## 2023-03-14 ENCOUNTER — Encounter (HOSPITAL_COMMUNITY): Payer: Self-pay

## 2023-03-14 ENCOUNTER — Ambulatory Visit (HOSPITAL_COMMUNITY)
Admission: EM | Admit: 2023-03-14 | Discharge: 2023-03-14 | Disposition: A | Discharge status: Home / Self Care | Payer: Medicaid Other | Attending: Emergency Medicine | Admitting: Emergency Medicine

## 2023-03-14 DIAGNOSIS — S39012A Strain of muscle, fascia and tendon of lower back, initial encounter: Secondary | ICD-10-CM | POA: Diagnosis not present

## 2023-03-14 DIAGNOSIS — J069 Acute upper respiratory infection, unspecified: Secondary | ICD-10-CM

## 2023-03-14 MED ORDER — METHOCARBAMOL 500 MG PO TABS: 500.0000 mg | ORAL_TABLET | Freq: Two times a day (BID) | ORAL | 0 refills | Status: AC

## 2023-03-14 MED ORDER — BENZONATATE 100 MG PO CAPS: 100.0000 mg | ORAL_CAPSULE | Freq: Three times a day (TID) | ORAL | 0 refills | Status: AC

## 2023-03-14 MED ORDER — PROMETHAZINE-DM 6.25-15 MG/5ML PO SYRP: 5.0000 mL | ORAL_SOLUTION | Freq: Three times a day (TID) | ORAL | 0 refills | Status: AC | PRN

## 2023-03-14 NOTE — ED Provider Notes (Signed)
[facility]    CSN: [id number] Arrival date & time: 03/14/23  1745      History   Chief Complaint Chief Complaint  Patient presents with   Cough   Back Pain    HPI Eileen Melton is a 27 y.o. female.   Patient reports to clinic for cough, congestion, and sore throat over the past 3 days.  Cough has been nonproductive.  Patient has a 9-1/2-year-old son who was recently diagnosed with RSV who has similar symptoms.  She was coughing prior to laying down in the bed with her son earlier for a nap, when she woke up she has severe left-sided thoracic back pain.  Pain intensifies with movement, flexion and bending.  Denies any fevers, denies chest pain or shortness of breath.  No documented history of asthma, smokes a few nicotine products a week.  The history is provided by the patient.  Cough Associated symptoms: sore throat   Associated symptoms: no chest pain, no chills, no fever and no shortness of breath   Back Pain Associated symptoms: no abdominal pain, no chest pain, no dysuria and no fever     Past Medical History:  Diagnosis Date   Anxiety    per patient   Echogenic intracardiac focus of fetus on prenatal ultrasound 03/09/2020   Noted on initial and follow up.   Post-dates pregnancy 06/26/2020    Patient Active Problem List   Diagnosis Date Noted   Postpartum care and examination 07/27/2020   Anemia in pregnancy 07/27/2020   Anxiety 12/10/2019   Marijuana use 10/21/2018    Past Surgical History:  Procedure Laterality Date   NO PAST SURGERIES     WISDOM TOOTH EXTRACTION      OB History     Gravida  2   Para  1   Term  1   Preterm  0   AB  1   Living  1      SAB  0   IAB  1   Ectopic  0   Multiple  0   Live Births  1            Home Medications    Prior to Admission medications   Medication Sig Start Date End Date Taking? Authorizing Provider  benzonatate (TESSALON) 100 MG capsule Take 1 capsule (100 mg total) by  mouth every 8 (eight) hours. 03/14/23  Yes Louretta Shorten, [place], FNP  methocarbamol (ROBAXIN) 500 MG tablet Take 1 tablet (500 mg total) by mouth 2 (two) times daily. 03/14/23  Yes Louretta Shorten, [place], FNP  promethazine-dextromethorphan (PROMETHAZINE-DM) 6.25-15 MG/5ML syrup Take 5 mLs by mouth 3 (three) times daily as needed for cough. 03/14/23  Yes Nadira Single, [place], FNP    Family History Family History  Problem Relation Age of Onset   Aneurysm Mother    Cancer Maternal Grandmother    Arthritis Father    Hypertension Father    Hyperlipidemia Father     Social History Social History   Tobacco Use   Smoking status: Former    Types: Cigars    Quit date: 10/10/2019    Years since quitting: 3.4   Smokeless tobacco: Never  Vaping Use   Vaping Use: Never used  Substance Use Topics   Alcohol use: Not Currently    Comment: holidays   Drug use: Not Currently    Frequency: 7.0 times per week    Types: Marijuana    Comment: stopped + preg- 10/20  Allergies   Kiwi extract and Hydrocodone-acetaminophen   Review of Systems Review of Systems  Constitutional:  Negative for chills and fever.  HENT:  Positive for congestion and sore throat.   Respiratory:  Positive for cough. Negative for shortness of breath.   Cardiovascular:  Negative for chest pain.  Gastrointestinal:  Negative for abdominal pain.  Genitourinary:  Negative for dysuria.  Musculoskeletal:  Positive for back pain.     Physical Exam Triage Vital Signs ED Triage Vitals  Enc Vitals Group     BP 03/14/23 1834 105/71     Pulse Rate 03/14/23 1834 98     Resp 03/14/23 1834 16     Temp 03/14/23 1834 98.7 F (37.1 C)     Temp Source 03/14/23 1834 Oral     SpO2 03/14/23 1834 96 %     Weight --      Height --      Head Circumference --      Peak Flow --      Pain Score 03/14/23 1831 7     Pain Loc --      Pain Edu? --      Excl. in [place]? --    No data found.  Updated Vital Signs BP 105/71 (BP Location:  Right Arm)   Pulse 98   Temp 98.7 F (37.1 C) (Oral)   Resp 16   LMP 03/02/2023   SpO2 96%   Visual Acuity Right Eye Distance:   Left Eye Distance:   Bilateral Distance:    Right Eye Near:   Left Eye Near:    Bilateral Near:     Physical Exam Vitals and nursing note reviewed.  Constitutional:      Appearance: Normal appearance.  HENT:     Head: Normocephalic and atraumatic.     Nose: Congestion and rhinorrhea present.     Mouth/Throat:     Mouth: Mucous membranes are moist.     Pharynx: Posterior oropharyngeal erythema present.  Eyes:     Pupils: Pupils are equal, round, and reactive to light.  Cardiovascular:     Rate and Rhythm: Normal rate and regular rhythm.     Heart sounds: Normal heart sounds. No murmur heard. Pulmonary:     Effort: Pulmonary effort is normal. No respiratory distress.  Musculoskeletal:        General: No swelling, tenderness, deformity or signs of injury. Normal range of motion.     Right lower leg: No edema.     Left lower leg: No edema.  Skin:    General: Skin is warm and dry.     Capillary Refill: Capillary refill takes less than 2 seconds.  Neurological:     Mental Status: She is alert and oriented to person, place, and time.  Psychiatric:        Mood and Affect: Mood normal.      UC Treatments / Results  Labs (all labs ordered are listed, but only abnormal results are displayed) Labs Reviewed - No data to display  EKG   Radiology No results found.  Procedures Procedures (including critical care time)  Medications Ordered in UC Medications - No data to display  Initial Impression / Assessment and Plan / UC Course  I have reviewed the triage vital signs and the nursing notes.  Pertinent labs & imaging results that were available during my care of the patient were reviewed by me and considered in my medical decision making (see chart for details).  Vitals  in triage reviewed, patient is hemodynamically stable.  Suspect  viral upper respiratory infection especially with close contact of son who was diagnosed with RSV.  Without red flag symptoms of numbness, tingling, incontinence or saddle anesthesia.  Left-sided lumbar musculoskeletal pain that is exacerbated with movement.  Cervical spine, thoracic and lumbar spine without step-off or deformity.  No recent falls or trauma.  Will treat symptomatically for viral upper respiratory infection and thoracic musculoskeletal back strain due to cough.  Symptomatic care discussed, patient verbalized understanding, no questions at this time.  Return and follow-up precautions discussed.    Final Clinical Impressions(s) / UC Diagnoses   Final diagnoses:  Viral URI with cough  Strain of lumbar region, initial encounter     Discharge Instructions      I believe you have a viral upper respiratory infection and your coughing led to a strain of your back.  Please take the Tessalon Perles throughout the day as needed for cough and the promethazine syrup at night for cough, do not drink or drive on this medication as it may make you drowsy.  The muscle relaxer can be beneficial as well, this medication may also make you drowsy, be extra cautious combining the two.   Please return to clinic or seek follow-up care if your pain persist beyond the next few weeks, you develop high fever, productive cough, or worsening of symptoms.      ED Prescriptions     Medication Sig Dispense Auth. Provider   methocarbamol (ROBAXIN) 500 MG tablet Take 1 tablet (500 mg total) by mouth 2 (two) times daily. 20 tablet Louretta Shorten, [place], [place]   promethazine-dextromethorphan (PROMETHAZINE-DM) 6.25-15 MG/5ML syrup Take 5 mLs by mouth 3 (three) times daily as needed for cough. 118 mL Louretta Shorten, [place], FNP   benzonatate (TESSALON) 100 MG capsule Take 1 capsule (100 mg total) by mouth every 8 (eight) hours. 21 capsule Levon Penning, [place], [place]      PDMP not reviewed this encounter.   Eileen Melton,  [place], [place] 03/14/23 [phone]

## 2023-03-14 NOTE — ED Triage Notes (Signed)
Pt is here for cough , wheezing , congestion and sore throat x 3days  and back since yesterday. Pt has not tried any OTC meds.

## 2023-03-14 NOTE — Discharge Instructions (Addendum)
I believe you have a viral upper respiratory infection and your coughing led to a strain of your back.  Please take the Tessalon Perles throughout the day as needed for cough and the promethazine syrup at night for cough, do not drink or drive on this medication as it may make you drowsy.  The muscle relaxer can be beneficial as well, this medication may also make you drowsy, be extra cautious combining the two.   Please return to clinic or seek follow-up care if your pain persist beyond the next few weeks, you develop high fever, productive cough, or worsening of symptoms.
